# Patient Record
Sex: Male | Born: 1965 | Race: White | Hispanic: No | Marital: Married | Smoking: Current every day smoker
Health system: Southern US, Community
[De-identification: ages and names within clinical notes are randomized; demographics above are authoritative.]

## PROBLEM LIST (undated history)

## (undated) DIAGNOSIS — F419 Anxiety disorder, unspecified: Secondary | ICD-10-CM

## (undated) DIAGNOSIS — M87051 Idiopathic aseptic necrosis of right femur: Secondary | ICD-10-CM

## (undated) DIAGNOSIS — T8859XA Other complications of anesthesia, initial encounter: Secondary | ICD-10-CM

## (undated) DIAGNOSIS — M2550 Pain in unspecified joint: Secondary | ICD-10-CM

## (undated) DIAGNOSIS — M549 Dorsalgia, unspecified: Secondary | ICD-10-CM

## (undated) DIAGNOSIS — Z8709 Personal history of other diseases of the respiratory system: Secondary | ICD-10-CM

## (undated) DIAGNOSIS — M1990 Unspecified osteoarthritis, unspecified site: Secondary | ICD-10-CM

## (undated) DIAGNOSIS — T4145XA Adverse effect of unspecified anesthetic, initial encounter: Secondary | ICD-10-CM

## (undated) DIAGNOSIS — M2540 Effusion, unspecified joint: Secondary | ICD-10-CM

## (undated) DIAGNOSIS — J449 Chronic obstructive pulmonary disease, unspecified: Secondary | ICD-10-CM

## (undated) HISTORY — PX: KNEE SURGERY: SHX244

---

## 2010-01-31 ENCOUNTER — Encounter: Admission: RE | Admit: 2010-01-31 | Discharge: 2010-01-31 | Payer: Self-pay | Admitting: Sports Medicine

## 2011-06-15 ENCOUNTER — Other Ambulatory Visit (HOSPITAL_COMMUNITY): Payer: Self-pay | Admitting: Family Medicine

## 2011-06-15 ENCOUNTER — Ambulatory Visit (HOSPITAL_COMMUNITY)
Admission: RE | Admit: 2011-06-15 | Discharge: 2011-06-15 | Disposition: A | Discharge status: Home / Self Care | Payer: 59 | Source: Ambulatory Visit | Attending: Family Medicine | Admitting: Family Medicine

## 2011-06-15 DIAGNOSIS — J4489 Other specified chronic obstructive pulmonary disease: Secondary | ICD-10-CM | POA: Insufficient documentation

## 2011-06-15 DIAGNOSIS — I10 Essential (primary) hypertension: Secondary | ICD-10-CM | POA: Insufficient documentation

## 2011-06-15 DIAGNOSIS — R062 Wheezing: Secondary | ICD-10-CM | POA: Insufficient documentation

## 2011-06-15 DIAGNOSIS — R0989 Other specified symptoms and signs involving the circulatory and respiratory systems: Secondary | ICD-10-CM | POA: Insufficient documentation

## 2011-06-15 DIAGNOSIS — F17200 Nicotine dependence, unspecified, uncomplicated: Secondary | ICD-10-CM | POA: Insufficient documentation

## 2011-06-15 DIAGNOSIS — J449 Chronic obstructive pulmonary disease, unspecified: Secondary | ICD-10-CM | POA: Insufficient documentation

## 2014-01-14 ENCOUNTER — Ambulatory Visit (HOSPITAL_COMMUNITY)
Admission: RE | Admit: 2014-01-14 | Discharge: 2014-01-14 | Disposition: A | Discharge status: Home / Self Care | Payer: 59 | Source: Ambulatory Visit | Attending: Family Medicine | Admitting: Family Medicine

## 2014-01-14 ENCOUNTER — Other Ambulatory Visit (HOSPITAL_COMMUNITY): Payer: Self-pay | Admitting: Family Medicine

## 2014-01-14 DIAGNOSIS — M79644 Pain in right finger(s): Secondary | ICD-10-CM | POA: Diagnosis not present

## 2014-01-14 DIAGNOSIS — M2550 Pain in unspecified joint: Secondary | ICD-10-CM

## 2015-03-19 ENCOUNTER — Other Ambulatory Visit: Payer: Self-pay | Admitting: Orthopedic Surgery

## 2015-03-26 ENCOUNTER — Inpatient Hospital Stay (HOSPITAL_COMMUNITY)
Admission: RE | Admit: 2015-03-26 | Discharge: 2015-03-26 | Disposition: A | Discharge status: Home / Self Care | Payer: Self-pay | Source: Ambulatory Visit

## 2015-05-10 ENCOUNTER — Encounter (HOSPITAL_COMMUNITY)
Admission: RE | Admit: 2015-05-10 | Discharge: 2015-05-10 | Disposition: A | Discharge status: Home / Self Care | Payer: 59 | Source: Ambulatory Visit | Attending: Orthopedic Surgery | Admitting: Orthopedic Surgery

## 2015-05-10 ENCOUNTER — Encounter (HOSPITAL_COMMUNITY): Payer: Self-pay

## 2015-05-10 DIAGNOSIS — Z01818 Encounter for other preprocedural examination: Secondary | ICD-10-CM | POA: Diagnosis not present

## 2015-05-10 DIAGNOSIS — Z01812 Encounter for preprocedural laboratory examination: Secondary | ICD-10-CM | POA: Diagnosis not present

## 2015-05-10 DIAGNOSIS — M1611 Unilateral primary osteoarthritis, right hip: Secondary | ICD-10-CM | POA: Insufficient documentation

## 2015-05-10 HISTORY — DX: Anxiety disorder, unspecified: F41.9

## 2015-05-10 HISTORY — DX: Chronic obstructive pulmonary disease, unspecified: J44.9

## 2015-05-10 LAB — CBC
HEMATOCRIT: 46 % (ref 39.0–52.0)
Hemoglobin: 16 g/dL (ref 13.0–17.0)
MCH: 33.1 pg (ref 26.0–34.0)
MCHC: 34.8 g/dL (ref 30.0–36.0)
MCV: 95.2 fL (ref 78.0–100.0)
PLATELETS: 243 10*3/uL (ref 150–400)
RBC: 4.83 MIL/uL (ref 4.22–5.81)
RDW: 14.5 % (ref 11.5–15.5)
WBC: 9 10*3/uL (ref 4.0–10.5)

## 2015-05-10 LAB — BASIC METABOLIC PANEL
ANION GAP: 11 (ref 5–15)
BUN: 9 mg/dL (ref 6–20)
CALCIUM: 9.5 mg/dL (ref 8.9–10.3)
CO2: 26 mmol/L (ref 22–32)
CREATININE: 1.08 mg/dL (ref 0.61–1.24)
Chloride: 101 mmol/L (ref 101–111)
GFR calc Af Amer: >60 mL/min (ref >60)
GFR calc non Af Amer: >60 mL/min (ref >60)
Glucose, Bld: 94 mg/dL (ref 65–99)
POTASSIUM: 4.6 mmol/L (ref 3.5–5.1)
Sodium: 138 mmol/L (ref 135–145)

## 2015-05-10 LAB — SURGICAL PCR SCREEN
MRSA, PCR: NEGATIVE
STAPHYLOCOCCUS AUREUS: NEGATIVE

## 2015-05-10 NOTE — Pre-Procedure Instructions (Signed)
    Paul Francis  05/10/2015      [id number] - [place], [place] - [address] [place] [address] Phone: [phone] Fax: [phone]    Your procedure is scheduled on 05/18/15.  Report to [facility] Admitting at 530 A.M.  Call this number if you have problems the morning of surgery:  [phone]   Remember:  Do not eat food or drink liquids after midnight.  Take these medicines the morning of surgery with A SIP OF WATER- none   Do not wear jewelry, make-up or nail polish.  Do not wear lotions, powders, or perfumes.  You may wear deodorant.  Do not shave 48 hours prior to surgery.  Men may shave face and neck.  Do not bring valuables to the hospital.  [facility] is not responsible for any belongings or valuables.  Contacts, dentures or bridgework may not be worn into surgery.  Leave your suitcase in the car.  After surgery it may be brought to your room.  For patients admitted to the hospital, discharge time will be determined by your treatment team.  Patients discharged the day of surgery will not be allowed to drive home.   Name and phone number of your driver:   Special instructions:   Please read over the following fact sheets that you were given. Pain Booklet, Coughing and Deep Breathing, MRSA Information and Surgical Site Infection Prevention

## 2015-05-17 MED ORDER — CEFAZOLIN SODIUM-DEXTROSE 2-3 GM-% IV SOLR
2.0000 g | INTRAVENOUS | Status: AC
  Administered 2015-05-18: 2 g via INTRAVENOUS
  Filled 2015-05-17: qty 50

## 2015-05-18 ENCOUNTER — Inpatient Hospital Stay (HOSPITAL_COMMUNITY)
Admission: RE | Admit: 2015-05-18 | Discharge: 2015-05-19 | DRG: 470 | Disposition: A | Discharge status: Home / Self Care | Payer: 59 | Source: Ambulatory Visit | Attending: Orthopedic Surgery | Admitting: Orthopedic Surgery

## 2015-05-18 ENCOUNTER — Encounter (HOSPITAL_COMMUNITY): Payer: Self-pay | Admitting: Certified Registered Nurse Anesthetist

## 2015-05-18 ENCOUNTER — Inpatient Hospital Stay (HOSPITAL_COMMUNITY): Payer: 59

## 2015-05-18 ENCOUNTER — Encounter (HOSPITAL_COMMUNITY)
Admission: RE | Disposition: A | Discharge status: Home / Self Care | Payer: Self-pay | Source: Ambulatory Visit | Attending: Orthopedic Surgery

## 2015-05-18 ENCOUNTER — Inpatient Hospital Stay (HOSPITAL_COMMUNITY): Payer: 59 | Admitting: Certified Registered Nurse Anesthetist

## 2015-05-18 DIAGNOSIS — M87052 Idiopathic aseptic necrosis of left femur: Secondary | ICD-10-CM | POA: Diagnosis present

## 2015-05-18 DIAGNOSIS — F17210 Nicotine dependence, cigarettes, uncomplicated: Secondary | ICD-10-CM | POA: Diagnosis present

## 2015-05-18 DIAGNOSIS — J449 Chronic obstructive pulmonary disease, unspecified: Secondary | ICD-10-CM | POA: Diagnosis present

## 2015-05-18 DIAGNOSIS — M1610 Unilateral primary osteoarthritis, unspecified hip: Secondary | ICD-10-CM

## 2015-05-18 DIAGNOSIS — F419 Anxiety disorder, unspecified: Secondary | ICD-10-CM | POA: Diagnosis present

## 2015-05-18 DIAGNOSIS — M879 Osteonecrosis, unspecified: Secondary | ICD-10-CM | POA: Diagnosis present

## 2015-05-18 HISTORY — PX: TOTAL HIP ARTHROPLASTY: SHX124

## 2015-05-18 SURGERY — ARTHROPLASTY, HIP, TOTAL,POSTERIOR APPROACH
Anesthesia: Monitor Anesthesia Care | Site: Hip | Laterality: Left

## 2015-05-18 MED ORDER — DEXAMETHASONE SODIUM PHOSPHATE 10 MG/ML IJ SOLN
10.0000 mg | Freq: Once | INTRAMUSCULAR | Status: AC
  Administered 2015-05-19: 10 mg via INTRAVENOUS
  Filled 2015-05-18: qty 1

## 2015-05-18 MED ORDER — LACTATED RINGERS IV SOLN
INTRAVENOUS | Status: DC | PRN
  Administered 2015-05-18 (×2): via INTRAVENOUS

## 2015-05-18 MED ORDER — METHOCARBAMOL 500 MG PO TABS: 500.0000 mg | ORAL_TABLET | Freq: Four times a day (QID) | ORAL | Status: DC | PRN

## 2015-05-18 MED ORDER — HYDROMORPHONE HCL 1 MG/ML IJ SOLN: 1.0000 mg | INTRAMUSCULAR | Status: DC | PRN

## 2015-05-18 MED ORDER — OXYCODONE HCL 5 MG/5ML PO SOLN
5.0000 mg | Freq: Once | ORAL | Status: DC | PRN
Start: 2015-05-18 — End: 2015-05-18

## 2015-05-18 MED ORDER — PHENOL 1.4 % MT LIQD: 1.0000 | OROMUCOSAL | Status: DC | PRN

## 2015-05-18 MED ORDER — RIVAROXABAN 10 MG PO TABS
10.0000 mg | ORAL_TABLET | Freq: Every day | ORAL | Status: DC
  Administered 2015-05-19: 10 mg via ORAL
  Filled 2015-05-18: qty 1

## 2015-05-18 MED ORDER — FENTANYL CITRATE (PF) 100 MCG/2ML IJ SOLN
INTRAMUSCULAR | Status: DC | PRN
  Administered 2015-05-18 (×2): 25 ug via INTRAVENOUS

## 2015-05-18 MED ORDER — FENTANYL CITRATE (PF) 250 MCG/5ML IJ SOLN
INTRAMUSCULAR | Status: AC
  Filled 2015-05-18: qty 5

## 2015-05-18 MED ORDER — ONDANSETRON HCL 4 MG/2ML IJ SOLN
INTRAMUSCULAR | Status: AC
  Filled 2015-05-18: qty 2

## 2015-05-18 MED ORDER — CEFAZOLIN SODIUM-DEXTROSE 2-3 GM-% IV SOLR
2.0000 g | Freq: Four times a day (QID) | INTRAVENOUS | Status: AC
  Administered 2015-05-18 (×2): 2 g via INTRAVENOUS
  Filled 2015-05-18: qty 50

## 2015-05-18 MED ORDER — ONDANSETRON HCL 4 MG/2ML IJ SOLN
INTRAMUSCULAR | Status: DC | PRN
  Administered 2015-05-18: 4 mg via INTRAVENOUS

## 2015-05-18 MED ORDER — POLYETHYLENE GLYCOL 3350 17 G PO PACK: 17.0000 g | PACK | Freq: Every day | ORAL | Status: DC | PRN

## 2015-05-18 MED ORDER — PROPOFOL 10 MG/ML IV BOLUS
INTRAVENOUS | Status: AC
  Filled 2015-05-18: qty 20

## 2015-05-18 MED ORDER — ALUM & MAG HYDROXIDE-SIMETH 200-200-20 MG/5ML PO SUSP: 30.0000 mL | ORAL | Status: DC | PRN

## 2015-05-18 MED ORDER — METOCLOPRAMIDE HCL 5 MG PO TABS: 5.0000 mg | ORAL_TABLET | Freq: Three times a day (TID) | ORAL | Status: DC | PRN

## 2015-05-18 MED ORDER — HYDROMORPHONE HCL 1 MG/ML IJ SOLN
INTRAMUSCULAR | Status: AC
  Filled 2015-05-18: qty 1

## 2015-05-18 MED ORDER — MENTHOL 3 MG MT LOZG: 1.0000 | LOZENGE | OROMUCOSAL | Status: DC | PRN

## 2015-05-18 MED ORDER — POTASSIUM CHLORIDE IN NACL 20-0.45 MEQ/L-% IV SOLN
INTRAVENOUS | Status: DC
  Administered 2015-05-18: 21:00:00 via INTRAVENOUS
  Filled 2015-05-18 (×4): qty 1000

## 2015-05-18 MED ORDER — ONDANSETRON HCL 4 MG PO TABS: 4.0000 mg | ORAL_TABLET | Freq: Four times a day (QID) | ORAL | Status: DC | PRN

## 2015-05-18 MED ORDER — ACETAMINOPHEN 325 MG PO TABS: 325.0000 mg | ORAL_TABLET | ORAL | Status: DC | PRN

## 2015-05-18 MED ORDER — BISACODYL 10 MG RE SUPP: 10.0000 mg | Freq: Every day | RECTAL | Status: DC | PRN

## 2015-05-18 MED ORDER — MIDAZOLAM HCL 2 MG/2ML IJ SOLN
INTRAMUSCULAR | Status: AC
  Filled 2015-05-18: qty 2

## 2015-05-18 MED ORDER — BACLOFEN 10 MG PO TABS: 10.0000 mg | ORAL_TABLET | Freq: Three times a day (TID) | ORAL | Status: DC

## 2015-05-18 MED ORDER — ONDANSETRON HCL 4 MG/2ML IJ SOLN: 4.0000 mg | Freq: Four times a day (QID) | INTRAMUSCULAR | Status: DC | PRN

## 2015-05-18 MED ORDER — DOCUSATE SODIUM 100 MG PO CAPS
100.0000 mg | ORAL_CAPSULE | Freq: Two times a day (BID) | ORAL | Status: DC
  Administered 2015-05-18 – 2015-05-19 (×2): 100 mg via ORAL
  Filled 2015-05-18 (×2): qty 1

## 2015-05-18 MED ORDER — OXYCODONE-ACETAMINOPHEN 10-325 MG PO TABS: 1.0000 | ORAL_TABLET | Freq: Four times a day (QID) | ORAL | Status: DC | PRN

## 2015-05-18 MED ORDER — SENNA 8.6 MG PO TABS
1.0000 | ORAL_TABLET | Freq: Two times a day (BID) | ORAL | Status: DC
  Administered 2015-05-18 – 2015-05-19 (×2): 8.6 mg via ORAL
  Filled 2015-05-18 (×2): qty 1

## 2015-05-18 MED ORDER — OXYCODONE HCL 5 MG PO TABS
5.0000 mg | ORAL_TABLET | ORAL | Status: DC | PRN
  Administered 2015-05-18 (×2): 10 mg via ORAL
  Filled 2015-05-18 (×2): qty 2

## 2015-05-18 MED ORDER — SODIUM CHLORIDE 0.9 % IR SOLN
Status: DC | PRN
  Administered 2015-05-18: 1000 mL

## 2015-05-18 MED ORDER — PROPOFOL 500 MG/50ML IV EMUL
INTRAVENOUS | Status: DC | PRN
  Administered 2015-05-18: 100 ug/kg/min via INTRAVENOUS

## 2015-05-18 MED ORDER — METOCLOPRAMIDE HCL 5 MG/ML IJ SOLN: 5.0000 mg | Freq: Three times a day (TID) | INTRAMUSCULAR | Status: DC | PRN

## 2015-05-18 MED ORDER — DIPHENHYDRAMINE HCL 12.5 MG/5ML PO ELIX: 12.5000 mg | ORAL_SOLUTION | ORAL | Status: DC | PRN

## 2015-05-18 MED ORDER — BUPIVACAINE HCL (PF) 0.5 % IJ SOLN
INTRAMUSCULAR | Status: DC | PRN
  Administered 2015-05-18: 3 mL via INTRATHECAL

## 2015-05-18 MED ORDER — ACETAMINOPHEN 160 MG/5ML PO SOLN: 325.0000 mg | ORAL | Status: DC | PRN

## 2015-05-18 MED ORDER — ZOLPIDEM TARTRATE 5 MG PO TABS: 5.0000 mg | ORAL_TABLET | Freq: Every evening | ORAL | Status: DC | PRN

## 2015-05-18 MED ORDER — MIDAZOLAM HCL 5 MG/5ML IJ SOLN
INTRAMUSCULAR | Status: DC | PRN
  Administered 2015-05-18 (×2): 1 mg via INTRAVENOUS

## 2015-05-18 MED ORDER — KETOROLAC TROMETHAMINE 15 MG/ML IJ SOLN
INTRAMUSCULAR | Status: AC
  Filled 2015-05-18: qty 1

## 2015-05-18 MED ORDER — DEXTROSE 5 % IV SOLN
2.0000 g | INTRAVENOUS | Status: DC
  Filled 2015-05-18: qty 20

## 2015-05-18 MED ORDER — SENNA-DOCUSATE SODIUM 8.6-50 MG PO TABS: 2.0000 | ORAL_TABLET | Freq: Every day | ORAL | Status: DC

## 2015-05-18 MED ORDER — OXYCODONE HCL 5 MG PO TABS: 5.0000 mg | ORAL_TABLET | Freq: Once | ORAL | Status: DC | PRN

## 2015-05-18 MED ORDER — ACETAMINOPHEN 325 MG PO TABS: 650.0000 mg | ORAL_TABLET | Freq: Four times a day (QID) | ORAL | Status: DC | PRN

## 2015-05-18 MED ORDER — RIVAROXABAN 10 MG PO TABS: 10.0000 mg | ORAL_TABLET | Freq: Every day | ORAL | Status: DC

## 2015-05-18 MED ORDER — KETOROLAC TROMETHAMINE 15 MG/ML IJ SOLN
15.0000 mg | Freq: Four times a day (QID) | INTRAMUSCULAR | Status: AC
  Administered 2015-05-18 – 2015-05-19 (×4): 15 mg via INTRAVENOUS
  Filled 2015-05-18 (×3): qty 1

## 2015-05-18 MED ORDER — DEXTROSE 5 % IV SOLN
500.0000 mg | Freq: Four times a day (QID) | INTRAVENOUS | Status: DC | PRN
  Filled 2015-05-18: qty 5

## 2015-05-18 MED ORDER — HYDROMORPHONE HCL 1 MG/ML IJ SOLN
0.2500 mg | INTRAMUSCULAR | Status: DC | PRN
  Administered 2015-05-18 (×2): 0.5 mg via INTRAVENOUS

## 2015-05-18 MED ORDER — MAGNESIUM CITRATE PO SOLN: 1.0000 | Freq: Once | ORAL | Status: DC | PRN

## 2015-05-18 MED ORDER — ONDANSETRON HCL 4 MG PO TABS: 4.0000 mg | ORAL_TABLET | Freq: Three times a day (TID) | ORAL | Status: DC | PRN

## 2015-05-18 MED ORDER — ACETAMINOPHEN 650 MG RE SUPP: 650.0000 mg | Freq: Four times a day (QID) | RECTAL | Status: DC | PRN

## 2015-05-18 SURGICAL SUPPLY — 63 items
BIT DRILL 5/64X5 DISP (BIT) ×3 IMPLANT
BLADE SAW SAG 73X25 THK (BLADE) ×2
BLADE SAW SGTL 73X25 THK (BLADE) ×1 IMPLANT
BRUSH FEMORAL CANAL (MISCELLANEOUS) IMPLANT
CAPT HIP TOTAL 2 ×2 IMPLANT
CLOSURE STERI-STRIP 1/2X4 (GAUZE/BANDAGES/DRESSINGS)
CLSR STERI-STRIP ANTIMIC 1/2X4 (GAUZE/BANDAGES/DRESSINGS) ×2 IMPLANT
COVER SURGICAL LIGHT HANDLE (MISCELLANEOUS) ×3 IMPLANT
DRAPE IMP U-DRAPE 54X76 (DRAPES) ×1 IMPLANT
DRAPE INCISE IOBAN 66X45 STRL (DRAPES) IMPLANT
DRAPE ORTHO SPLIT 77X108 STRL (DRAPES) ×6
DRAPE PROXIMA HALF (DRAPES) ×4 IMPLANT
DRAPE SURG ORHT 6 SPLT 77X108 (DRAPES) ×2 IMPLANT
DRAPE U-SHAPE 47X51 STRL (DRAPES) ×3 IMPLANT
DRSG MEPILEX BORDER 4X12 (GAUZE/BANDAGES/DRESSINGS) IMPLANT
DRSG MEPILEX BORDER 4X8 (GAUZE/BANDAGES/DRESSINGS) ×2 IMPLANT
DRSG PAD ABDOMINAL 8X10 ST (GAUZE/BANDAGES/DRESSINGS) IMPLANT
DURAPREP 26ML APPLICATOR (WOUND CARE) ×3 IMPLANT
ELECT CAUTERY BLADE 6.4 (BLADE) ×3 IMPLANT
ELECT REM PT RETURN 9FT ADLT (ELECTROSURGICAL) ×3
ELECTRODE REM PT RTRN 9FT ADLT (ELECTROSURGICAL) ×1 IMPLANT
GLOVE BIOGEL PI IND STRL 8 (GLOVE) ×1 IMPLANT
GLOVE BIOGEL PI INDICATOR 8 (GLOVE) ×2
GLOVE BIOGEL PI ORTHO PRO SZ8 (GLOVE) ×2
GLOVE ORTHO TXT STRL SZ7.5 (GLOVE) ×3 IMPLANT
GLOVE PI ORTHO PRO STRL SZ8 (GLOVE) ×1 IMPLANT
GLOVE SURG ORTHO 8.0 STRL STRW (GLOVE) ×3 IMPLANT
GOWN STRL REUS W/ TWL XL LVL3 (GOWN DISPOSABLE) ×1 IMPLANT
GOWN STRL REUS W/TWL 2XL LVL3 (GOWN DISPOSABLE) ×3 IMPLANT
GOWN STRL REUS W/TWL XL LVL3 (GOWN DISPOSABLE) ×3
HANDPIECE INTERPULSE COAX TIP (DISPOSABLE)
HOOD PEEL AWAY FACE SHEILD DIS (HOOD) ×8 IMPLANT
KIT BASIN OR (CUSTOM PROCEDURE TRAY) ×3 IMPLANT
KIT ROOM TURNOVER OR (KITS) ×3 IMPLANT
MANIFOLD NEPTUNE II (INSTRUMENTS) ×3 IMPLANT
NDL HYPO 25GX1X1/2 BEV (NEEDLE) ×1 IMPLANT
NDL SUT .5 MAYO 1.404X.05X (NEEDLE) ×1 IMPLANT
NEEDLE HYPO 25GX1X1/2 BEV (NEEDLE) ×3 IMPLANT
NEEDLE MAYO TAPER (NEEDLE) ×3
NS IRRIG 1000ML POUR BTL (IV SOLUTION) ×3 IMPLANT
PACK TOTAL JOINT (CUSTOM PROCEDURE TRAY) ×3 IMPLANT
PAD ARMBOARD 7.5X6 YLW CONV (MISCELLANEOUS) ×6 IMPLANT
PILLOW ABDUCTION HIP (SOFTGOODS) ×3 IMPLANT
PRESSURIZER FEMORAL UNIV (MISCELLANEOUS) IMPLANT
RETRIEVER SUT HEWSON (MISCELLANEOUS) ×3 IMPLANT
SET HNDPC FAN SPRY TIP SCT (DISPOSABLE) IMPLANT
SPONGE LAP 4X18 X RAY DECT (DISPOSABLE) IMPLANT
SUCTION FRAZIER HANDLE 10FR (MISCELLANEOUS) ×2
SUCTION TUBE FRAZIER 10FR DISP (MISCELLANEOUS) ×1 IMPLANT
SUT FIBERWIRE #2 38 REV NDL BL (SUTURE) ×9
SUT MNCRL AB 4-0 PS2 18 (SUTURE) ×3 IMPLANT
SUT VIC AB 0 CT1 27 (SUTURE) ×3
SUT VIC AB 0 CT1 27XBRD ANBCTR (SUTURE) ×1 IMPLANT
SUT VIC AB 2-0 CT1 27 (SUTURE) ×3
SUT VIC AB 2-0 CT1 TAPERPNT 27 (SUTURE) ×1 IMPLANT
SUT VIC AB 3-0 SH 8-18 (SUTURE) ×3 IMPLANT
SUTURE FIBERWR#2 38 REV NDL BL (SUTURE) ×3 IMPLANT
SYR CONTROL 10ML LL (SYRINGE) ×3 IMPLANT
TOWEL OR 17X24 6PK STRL BLUE (TOWEL DISPOSABLE) ×3 IMPLANT
TOWEL OR 17X26 10 PK STRL BLUE (TOWEL DISPOSABLE) ×3 IMPLANT
TOWER CARTRIDGE SMART MIX (DISPOSABLE) IMPLANT
TRAY FOLEY CATH 14FR (SET/KITS/TRAYS/PACK) ×2 IMPLANT
WATER STERILE IRR 1000ML POUR (IV SOLUTION) ×2 IMPLANT

## 2015-05-18 NOTE — H&P (Signed)
PREOPERATIVE H&P  Chief Complaint: AVN left hip  HPI: Paul Francis is a 50 y.o. male who presents for preoperative history and physical with a diagnosis of AVN left hip. Symptoms are rated as moderate to severe, and have been worsening.  This is significantly impairing activities of daily living.  He has elected for surgical management.   He has failed injections, activity modification, anti-inflammatories, and assistive devices.  Preoperative X-rays demonstrate end stage degenerative changes with osteophyte formation, loss of joint space, subchondral sclerosis.   Past Medical History  Diagnosis Date  . COPD (chronic obstructive pulmonary disease) (HCC)   . Anxiety    Past Surgical History  Procedure Laterality Date  . Knee surgery      needle stuck in knee  . Broken      rt hand   Social History   Social History  . Marital Status: Married    Spouse Name: N/A  . Number of Children: N/A  . Years of Education: N/A   Social History Main Topics  . Smoking status: Current Every Day Smoker -- 1.50 packs/day for 35 years  . Smokeless tobacco: None  . Alcohol Use: Yes     Comment: daily  . Drug Use: No  . Sexual Activity: Not Asked   Other Topics Concern  . None   Social History Narrative   History reviewed. No pertinent family history. No Known Allergies Prior to Admission medications   Not on File     Positive ROS: All other systems have been reviewed and were otherwise negative with the exception of those mentioned in the HPI and as above.  Physical Exam: General: Alert, no acute distress Cardiovascular: No pedal edema Respiratory: No cyanosis, no use of accessory musculature GI: No organomegaly, abdomen is soft and non-tender Skin: No lesions in the area of chief complaint Neurologic: Sensation intact distally Psychiatric: Patient is competent for consent with normal mood and affect Lymphatic: No axillary or cervical lymphadenopathy  MUSCULOSKELETAL: left  hip ROM 0-100 with pain with IR/ER, limited motion due to pain.  EHL intact.  Assessment: AVN left hip   Plan: Plan for Procedure(s): LEFT TOTAL HIP ARTHROPLASTY  The risks benefits and alternatives were discussed with the patient including but not limited to the risks of nonoperative treatment, versus surgical intervention including infection, bleeding, nerve injury,  blood clots, cardiopulmonary complications, morbidity, mortality, among others, and they were willing to proceed.   Eulas Post, MD Cell [phone]   05/18/2015 7:23 AM

## 2015-05-18 NOTE — Anesthesia Procedure Notes (Signed)
Spinal Patient location during procedure: OR Staffing Anesthesiologist: [place] Zou Preanesthetic Checklist Completed: patient identified, surgical consent, pre-op evaluation, timeout performed, IV checked, risks and benefits discussed and monitors and equipment checked Spinal Block Patient position: sitting Prep: site prepped and draped and DuraPrep Patient monitoring: heart rate, cardiac monitor, continuous pulse ox and blood pressure Approach: midline Location: L3-4 Injection technique: single-shot Needle Needle type: Pencan  Needle gauge: 24 G Needle length: 10 cm Assessment Sensory level: T8   

## 2015-05-18 NOTE — Op Note (Signed)
05/18/2015  9:24 AM  PATIENT:  Paul Francis   MRN: [id number]  PRE-OPERATIVE DIAGNOSIS:  Left hip avascular necrosis  POST-OPERATIVE DIAGNOSIS:  SAME  PROCEDURE:  Procedure(s): LEFT TOTAL HIP ARTHROPLASTY  PREOPERATIVE INDICATIONS:    Paul Francis is an 50 y.o. male who has a diagnosis of Avascular necrosis of bone of left hip ([place]) and elected for surgical management after failing conservative treatment.  The risks benefits and alternatives were discussed with the patient including but not limited to the risks of nonoperative treatment, versus surgical intervention including infection, bleeding, nerve injury, periprosthetic fracture, the need for revision surgery, dislocation, leg length discrepancy, blood clots, cardiopulmonary complications, morbidity, mortality, among others, and they were willing to proceed.     OPERATIVE REPORT     SURGEON:  Marchia Bond, MD    ASSISTANT:  Joya Gaskins, OPA-C  (Present throughout the entire procedure,  necessary for completion of procedure in a timely manner, assisting with retraction, instrumentation, and closure)     ANESTHESIA:  Spinal    COMPLICATIONS:  None.     COMPONENTS:  [employer] fit femur size 7 with a 36 mm +5 ceramic head ball and a gription acetabular shell size 54 with a neutral +4 polyethylene liner and a single cancellous screw    PROCEDURE IN DETAIL:   The patient was met in the holding area and  identified.  The appropriate hip was identified and marked at the operative site.  The patient was then transported to the OR  and  placed under general anesthesia.  At that point, the patient was  placed in the lateral decubitus position with the operative side up and  secured to the operating room table and all bony prominences padded.     The operative lower extremity was prepped from the iliac crest to the distal leg.  Sterile draping was performed.  Time out was performed prior to incision.      A routine  posterolateral approach was utilized via sharp dissection  carried down to the subcutaneous tissue.  Gross bleeders were Bovie coagulated.  The iliotibial band was identified and incised along the length of the skin incision.  Self-retaining retractors were  inserted.  With the hip internally rotated, the short external rotators  were identified. The piriformis and capsule was tagged with FiberWire, and the hip capsule released in a T-type fashion.  The femoral neck was exposed, and I resected the femoral neck using the appropriate jig. This was performed at approximately a thumb's breadth above the lesser trochanter.    I then exposed the deep acetabulum, cleared out any tissue including the ligamentum teres.  A wing retractor was placed.   After adequate visualization, I excised the labrum, and then sequentially reamed.  I placed the trial acetabulum, which seated nicely, and then impacted the real cup into place.  Appropriate version and inclination was confirmed clinically matching their bony anatomy, and also with the use of the jig.  The acetabulum was fairly shallow, and I did not have much bone to medialize. This ultimately left some uncoverage nearly circumferentially. For this reason I did use a screw to augment fixation although he did have good fit and fill from front to back.  A trial polyethylene liner was placed and the wing retractor removed.    I then prepared the proximal femur using the cookie-cutter, the lateralizing reamer, and then sequentially reamed and broached.  A trial broach, neck, and head was utilized, and  I reduced the hip and it was found to have excellent stability with functional range of motion. The trial components were then removed, and the real polyethylene liner was placed.  I initially trialed with a lipped liner, and had excellent posterior stability, although some impingement in external rotation. Trial with the neutral non-lipped liner provided better motion in  external rotation. The posterior stability was still excellent.  I then impacted the real femoral prosthesis into place into the appropriate version, slightly anteverted to the normal anatomy, and I impacted the real head ball into place. The hip was then reduced and taken through functional range of motion and found to have excellent stability. Leg lengths were restored.  I then used a 2 mm drill bits to pass the FiberWire suture from the capsule and piriformis through the greater trochanter, and secured this. Excellent posterior capsular repair was achieved. I also closed the T in the capsule.  I then irrigated the hip copiously again with pulse lavage, and repaired the fascia with Vicryl, followed by Vicryl for the subcutaneous tissue, Monocryl for the skin, Steri-Strips and sterile gauze. The wounds were injected. The patient was then awakened and returned to PACU in stable and satisfactory condition. There were no complications.  Marchia Bond, MD Orthopedic Surgeon [phone]   05/18/2015 9:24 AM

## 2015-05-18 NOTE — Anesthesia Preprocedure Evaluation (Signed)
Anesthesia Evaluation  Patient identified by MRN, date of birth, ID band Patient awake    Reviewed: Allergy & Precautions, NPO status , Patient's Chart, lab work & pertinent test results  Airway Mallampati: II  TM Distance: >3 FB     Dental  (+) Teeth Intact   Pulmonary neg shortness of breath, neg sleep apnea, neg COPD, neg recent URI, Current Smoker, neg PE   breath sounds clear to auscultation- rhonchi       Cardiovascular negative cardio ROS   Rhythm:Regular     Neuro/Psych Anxiety negative neurological ROS     GI/Hepatic negative GI ROS, Neg liver ROS,   Endo/Other  negative endocrine ROS  Renal/GU negative Renal ROS     Musculoskeletal  (+) Arthritis ,   Abdominal   Peds  Hematology negative hematology ROS (+)   Anesthesia Other Findings   Reproductive/Obstetrics                             Anesthesia Physical Anesthesia Plan  ASA: II  Anesthesia Plan: MAC and Spinal   Post-op Pain Management:    Induction: Intravenous  Airway Management Planned: Nasal Cannula, Natural Airway and Simple Face Mask  Additional Equipment: None  Intra-op Plan:   Post-operative Plan:   Informed Consent: I have reviewed the patients History and Physical, chart, labs and discussed the procedure including the risks, benefits and alternatives for the proposed anesthesia with the patient or authorized representative who has indicated his/her understanding and acceptance.   Dental advisory given  Plan Discussed with: CRNA and Surgeon  Anesthesia Plan Comments:         Anesthesia Quick Evaluation

## 2015-05-18 NOTE — Transfer of Care (Signed)
Immediate Anesthesia Transfer of Care Note  Patient: Paul Francis  Procedure(s) Performed: Procedure(s): LEFT TOTAL HIP ARTHROPLASTY (Left)  Patient Location: PACU  Anesthesia Type:MAC and Spinal  Level of Consciousness: awake, patient cooperative and responds to stimulation  Airway & Oxygen Therapy: Patient Spontanous Breathing  Post-op Assessment: Report given to RN and Post -op Vital signs reviewed and stable  Post vital signs: Reviewed and stable  Last Vitals:  Filed Vitals:   05/18/15 0638  BP: 165/95  Pulse: 77  Temp: 36.7 C  Resp: 16    Complications: No apparent anesthesia complications

## 2015-05-18 NOTE — Evaluation (Signed)
Physical Therapy Evaluation Patient Details Name: Paul Francis MRN: [id number] DOB: 14-Jul-1965 Today's Date: 05/18/2015   History of Present Illness  50 y.o. male admitted to [facility] on 05/18/15 for elective L THA posterior approach.  Pt with significant PMHx of AVN, COPD, anxiety, R hand fx, and left knee surgery (needles stuck in knee per chart).    Clinical Impression  Pt is POD #0 and is moving well. Min assist overall for short distance gait in the room with RW.  He did feel a non-painful pop when preforming hip abduction exercise in the bed prior to mobilizing.  With repeated hip abduction it did not recur, but pt was worried about it.  I advised him to talk to Dr. Dion Saucier when he came by. Pt reports his hips popped when he went to sit at baseline.  He was able to continue on with mobility and gait without complication. He will likely progress well enough to d/c home with his wife's assist and HHPT.   PT to follow acutely for deficits listed below.       Follow Up Recommendations Home health PT;Supervision for mobility/OOB    Equipment Recommendations  None recommended by PT (equipment was delivered to his home PTA)    Recommendations for Other Services   NA     Precautions / Restrictions Precautions Precautions: Posterior Hip Precaution Booklet Issued: Yes (comment) Precaution Comments: precaution handout given and reveiwed, hip exercise handout given Required Braces or Orthoses: Other Brace/Splint Other Brace/Splint: hip abduction pillow for in the bed.  Restrictions Weight Bearing Restrictions: Yes LLE Weight Bearing: Weight bearing as tolerated      Mobility  Bed Mobility Overal bed mobility: Needs Assistance Bed Mobility: Supine to Sit     Supine to sit: Min assist     General bed mobility comments: Min assist to help progress his left leg over EOB verbal cues for 1/2 bridge technique.   Transfers Overall transfer level: Needs assistance Equipment used: Rolling walker  (2 wheeled) Transfers: Sit to/from Stand Sit to Stand: Min assist;From elevated surface         General transfer comment: Min assist to help stabilize trunk and RW to get to standing.  Verbal cues for safe hand placement, but despite this, pt still pulled up with both hands on RW.   Ambulation/Gait Ambulation/Gait assistance: Min assist Ambulation Distance (Feet): 8 Feet Assistive device: Rolling walker (2 wheeled) Gait Pattern/deviations: Step-to pattern;Antalgic     General Gait Details: Min assist to support trunk for balance.  Verbal cues for correct LE sequencing and WBAT status.  Hip precautions reviewed before gaitn and mobility.  Handout given.  WBAT status also reviewed.          Balance Overall balance assessment: Needs assistance Sitting-balance support: Feet supported;No upper extremity supported Sitting balance-Leahy Scale: Good     Standing balance support: Bilateral upper extremity supported Standing balance-Leahy Scale: Poor                               Pertinent Vitals/Pain Pain Assessment: 0-10 Pain Score: 5  Pain Location: left thigh Pain Descriptors / Indicators: Aching;Burning Pain Intervention(s): Limited activity within patient's tolerance;Monitored during session;Repositioned;Patient requesting pain meds-RN notified    Home Living Family/patient expects to be discharged to:: Private residence Living Arrangements: Spouse/significant other Available Help at Discharge: Family;Available 24 hours/day Type of Home: House Home Access: Stairs to enter Entrance Stairs-Rails: None Entrance Stairs-Number of  Steps: 1 (small stoop) Home Layout: One level Home Equipment: Bedside commode;Walker - 2 wheels      Prior Function Level of Independence: Independent               Hand Dominance   Dominant Hand: Right    Extremity/Trunk Assessment   Upper Extremity Assessment: Defer to OT evaluation           Lower Extremity  Assessment: LLE deficits/detail   LLE Deficits / Details: left leg with normal post op pain and weakness.  Pt with at least 3/5 ankle, 3-/5 knee, 2/5 hip.  Of note, during warm up exercises in bed pt reported and therapist felt a pop on the lateral aspect of his leg.  Pt reported is was not painful, only unnerving.  He also reported that his hip used to pop like that on both sides when he went to sit down.  The pop was on the first rep of hip abduction and did not happen again with consecutive reps.  Given that I felt it at his knee I think it may have been his ITB.  I advised hip to speak with Dr. Dion Saucier about it.   Cervical / Trunk Assessment: Normal  Communication   Communication: No difficulties  Cognition Arousal/Alertness: Awake/alert Behavior During Therapy: WFL for tasks assessed/performed Overall Cognitive Status: Within Functional Limits for tasks assessed                         Exercises Total Joint Exercises Ankle Circles/Pumps: AROM;Both;20 reps Quad Sets: AROM;Left;10 reps Heel Slides: AAROM;Left;10 reps Hip ABduction/ADduction: AAROM;Left;10 reps (on first rep palpable pop at left hip, no pain, unnerving)      Assessment/Plan    PT Assessment Patient needs continued PT services  PT Diagnosis Difficulty walking;Abnormality of gait;Generalized weakness;Acute pain   PT Problem List Decreased strength;Decreased range of motion;Decreased activity tolerance;Decreased balance;Decreased mobility;Decreased knowledge of use of DME;Decreased knowledge of precautions;Pain  PT Treatment Interventions DME instruction;Gait training;Stair training;Functional mobility training;Therapeutic activities;Therapeutic exercise;Balance training;Neuromuscular re-education;Patient/family education;Modalities;Manual techniques   PT Goals (Current goals can be found in the Care Plan section) Acute Rehab PT Goals Patient Stated Goal: to decrease left hip and knee pain PT Goal Formulation:  With patient Time For Goal Achievement: 05/25/15 Potential to Achieve Goals: Good    Frequency 7X/week           End of Session   Activity Tolerance: Patient tolerated treatment well Patient left: in chair;with call bell/phone within reach           Time: 1610-9604 PT Time Calculation (min) (ACUTE ONLY): 35 min   Charges:   PT Evaluation $PT Eval Moderate Complexity: 1 Procedure PT Treatments $Therapeutic Activity: 8-22 mins        Sharlena Kristensen B. Cheryel Kyte, PT, DPT [phone]   05/18/2015, 5:52 PM

## 2015-05-18 NOTE — Progress Notes (Signed)
Utilization review completed.  

## 2015-05-18 NOTE — Discharge Instructions (Signed)

## 2015-05-19 ENCOUNTER — Encounter (HOSPITAL_COMMUNITY): Payer: Self-pay | Admitting: Orthopedic Surgery

## 2015-05-19 LAB — CBC
HCT: 36.1 % — ABNORMAL LOW (ref 39.0–52.0)
Hemoglobin: 12.5 g/dL — ABNORMAL LOW (ref 13.0–17.0)
MCH: 32.9 pg (ref 26.0–34.0)
MCHC: 34.6 g/dL (ref 30.0–36.0)
MCV: 95 fL (ref 78.0–100.0)
Platelets: 192 10*3/uL (ref 150–400)
RBC: 3.8 MIL/uL — ABNORMAL LOW (ref 4.22–5.81)
RDW: 14.2 % (ref 11.5–15.5)
WBC: 9.4 10*3/uL (ref 4.0–10.5)

## 2015-05-19 LAB — BASIC METABOLIC PANEL
Anion gap: 6 (ref 5–15)
BUN: 11 mg/dL (ref 6–20)
CALCIUM: 8.5 mg/dL — AB (ref 8.9–10.3)
CO2: 26 mmol/L (ref 22–32)
CREATININE: 1.11 mg/dL (ref 0.61–1.24)
Chloride: 104 mmol/L (ref 101–111)
GFR calc non Af Amer: >60 mL/min (ref >60)
Glucose, Bld: 110 mg/dL — ABNORMAL HIGH (ref 65–99)
Potassium: 4.3 mmol/L (ref 3.5–5.1)
SODIUM: 136 mmol/L (ref 135–145)

## 2015-05-19 NOTE — Discharge Summary (Signed)
Physician Discharge Summary  Patient ID: Paul Francis MRN: [id number] DOB/AGE: [birth year] 50 y.o.  Admit date: 05/18/2015 Discharge date: 05/19/2015  Admission Diagnoses:  Avascular necrosis of bone of left hip [facility])  Discharge Diagnoses:  Principal Problem:   Avascular necrosis of bone of left hip [facility])   Past Medical History  Diagnosis Date  . COPD (chronic obstructive pulmonary disease) (HCC)   . Anxiety   . Avascular necrosis of bone of left hip (HCC) 05/18/2015    Surgeries: Procedure(s): LEFT TOTAL HIP ARTHROPLASTY on 05/18/2015   Consultants (if any):    Discharged Condition: Improved  Hospital Course: Paul Francis is an 50 y.o. male who was admitted 05/18/2015 with a diagnosis of Avascular necrosis of bone of left hip (HCC) and went to the operating room on 05/18/2015 and underwent the above named procedures.    He was given perioperative antibiotics:  Anti-infectives    Start     Dose/Rate Route Frequency Ordered Stop   05/18/15 1400  ceFAZolin (ANCEF) IVPB 2 g/50 mL premix     2 g 100 mL/hr over 30 Minutes Intravenous Every 6 hours 05/18/15 1352 05/18/15 2131   05/18/15 1400  ceFAZolin (ANCEF) 2 g in dextrose 5 % 50 mL IVPB  Status:  Discontinued     2 g 100 mL/hr over 30 Minutes Intravenous To Post Anesthesia Care Unit 05/18/15 1355 05/18/15 1509   05/18/15 0600  ceFAZolin (ANCEF) IVPB 2 g/50 mL premix     2 g 100 mL/hr over 30 Minutes Intravenous On call to O.R. 05/17/15 1210 05/18/15 0759    .  He was given sequential compression devices, early ambulation, and xarelto for DVT prophylaxis.  He benefited maximally from the hospital stay and there were no complications.    Recent vital signs:  Filed Vitals:   05/19/15 0120 05/19/15 0507  BP:  148/86  Pulse:  90  Temp: 99.8 F (37.7 C) 100.1 F (37.8 C)  Resp:  18    Recent laboratory studies:  Lab Results  Component Value Date   HGB 12.5* 05/19/2015   HGB 16.0 05/10/2015   Lab Results  Component  Value Date   WBC 9.4 05/19/2015   PLT 192 05/19/2015   No results found for: INR Lab Results  Component Value Date   NA 136 05/19/2015   K 4.3 05/19/2015   CL 104 05/19/2015   CO2 26 05/19/2015   BUN 11 05/19/2015   CREATININE 1.11 05/19/2015   GLUCOSE 110* 05/19/2015    Discharge Medications:     Medication List    TAKE these medications        baclofen 10 MG tablet  Commonly known as:  LIORESAL  Take 1 tablet (10 mg total) by mouth 3 (three) times daily. As needed for muscle spasm     ondansetron 4 MG tablet  Commonly known as:  ZOFRAN  Take 1 tablet (4 mg total) by mouth every 8 (eight) hours as needed for nausea or vomiting.     oxyCODONE-acetaminophen 10-325 MG tablet  Commonly known as:  PERCOCET  Take 1-2 tablets by mouth every 6 (six) hours as needed for pain. MAXIMUM TOTAL ACETAMINOPHEN DOSE IS 4000 MG PER DAY     rivaroxaban 10 MG Tabs tablet  Commonly known as:  XARELTO  Take 1 tablet (10 mg total) by mouth daily.     sennosides-docusate sodium 8.6-50 MG tablet  Commonly known as:  SENOKOT-S  Take 2 tablets by mouth daily.  Diagnostic Studies: Dg Pelvis Portable  05/18/2015  CLINICAL DATA:  Status post left hip replacement EXAM: PORTABLE PELVIS 1-2 VIEWS COMPARISON:  None. FINDINGS: A left hip replacement is noted. No acute bony abnormality is seen. Air is noted in surgical bed. Pelvic ring is intact. IMPRESSION: Status post left hip replacement Electronically Signed   By: Alcide Clever M.D.   On: 05/18/2015 11:21    Disposition: Final discharge disposition not confirmed        Follow-up Information    Follow up with Eulas Post, MD. Schedule an appointment as soon as possible for a visit in 2 weeks.   Specialty:  Orthopedic Surgery   Contact information:   [address] [place] [address] [phone]        Signed: Eulas Post 05/19/2015, 8:42 AM

## 2015-05-19 NOTE — Progress Notes (Signed)
Physical Therapy Treatment Patient Details Name: Paul Francis MRN: [id number] DOB: [birth year] Today's Date: 05/19/2015    History of Present Illness 50 y.o. male admitted to [facility] on 05/18/15 for elective L THA posterior approach.  Pt with significant PMHx of AVN, COPD, anxiety, R hand fx, and left knee surgery (needles stuck in knee per chart).      PT Comments    Pt is POD #1 and is progressing well with his mobility. He reported some lightheadedness/"hot" before ambulating.  Vitals checked and were stable.  We proceeded with caution.  He was able to walk with supervision and RW into the hallway without difficulty.  He needs further reinforcement of hip precautions, exercises, and to practice the curb step to enter his home this afternoon.    Follow Up Recommendations  Home health PT;Supervision for mobility/OOB     Equipment Recommendations  None recommended by PT (equipment delivered to his home PTA)    Recommendations for Other Services   NA     Precautions / Restrictions Precautions Precautions: Posterior Hip Precaution Booklet Issued: Yes (comment) Precaution Comments: pt reported 2/3 precautions to therapist today Required Braces or Orthoses: Other Brace/Splint Other Brace/Splint: hip abduction pillow for in the bed.  Restrictions Weight Bearing Restrictions: No LLE Weight Bearing: Weight bearing as tolerated    Mobility  Bed Mobility Overal bed mobility: Needs Assistance Bed Mobility: Supine to Sit     Supine to sit: Min assist     General bed mobility comments: A for LLE only  Transfers Overall transfer level: Needs assistance Equipment used: Rolling walker (2 wheeled) Transfers: Sit to/from Stand Sit to Stand: Supervision         General transfer comment: supervision for safety due to heavy reliance on hands for transitions with RW.    Ambulation/Gait Ambulation/Gait assistance: Supervision Ambulation Distance (Feet): 200 Feet Assistive device:  Rolling walker (2 wheeled) Gait Pattern/deviations: Step-to pattern;Antalgic Gait velocity: decreased Gait velocity interpretation: Below normal speed for age/gender General Gait Details: Chair to follow for safety as pt reported some hot/lightheaded feelings in room prior to gait.  Vitals taken and were stable. Moderately antalgic gait pattern.  No cues needed for sequence and pt educated that when he transitions to a cane with HHPT he will hold the cane in the right hand.  This PM we will focus on step through gait pattern and more normal heel to toe contact.    Stairs Stairs: Yes       General stair comments: Verbally reviewed up with the non surgical leg and down with the surgical leg with pt, but did not physically practice stoop yet.          Balance Overall balance assessment: Needs assistance Sitting-balance support: Feet supported;No upper extremity supported Sitting balance-Leahy Scale: Good     Standing balance support: Bilateral upper extremity supported Standing balance-Leahy Scale: Fair                      Cognition Arousal/Alertness: Awake/alert Behavior During Therapy: WFL for tasks assessed/performed Overall Cognitive Status: Within Functional Limits for tasks assessed                      Exercises Total Joint Exercises Ankle Circles/Pumps: AROM;Both;20 reps Quad Sets: AROM;Both;10 reps Heel Slides: AAROM;Left;10 reps Hip ABduction/ADduction: AAROM;Left;10 reps Long Arc Quad: AROM;Left;10 reps        Pertinent Vitals/Pain Pain Assessment: 0-10 Pain Score: 6  Pain Location: left  thigh, incision Pain Descriptors / Indicators: Aching;Burning Pain Intervention(s): Limited activity within patient's tolerance;Monitored during session;Repositioned    Home Living Family/patient expects to be discharged to:: Private residence Living Arrangements: Spouse/significant other Available Help at Discharge: Family;Available 24 hours/day Type of  Home: House Home Access: Stairs to enter Entrance Stairs-Rails: None Home Layout: One level Home Equipment: Bedside commode;Walker - 2 wheels      Prior Function Level of Independence: Independent          PT Goals (current goals can now be found in the care plan section) Acute Rehab PT Goals Patient Stated Goal: home today or tomorrow Progress towards PT goals: Progressing toward goals    Frequency  7X/week    PT Plan Current plan remains appropriate       End of Session   Activity Tolerance: Patient limited by pain;Patient limited by fatigue Patient left: in chair;with call bell/phone within reach     Time: 1042-1110 PT Time Calculation (min) (ACUTE ONLY): 28 min  Charges:  $Gait Training: 8-22 mins $Therapeutic Exercise: 8-22 mins                   Jaydeen Darley B. Eeva Schlosser, PT, DPT [phone]   05/19/2015, 11:19 AM

## 2015-05-19 NOTE — Anesthesia Postprocedure Evaluation (Signed)
Anesthesia Post Note  Patient: RIKER COLLIER  Procedure(s) Performed: Procedure(s) (LRB): LEFT TOTAL HIP ARTHROPLASTY (Left)  Patient location during evaluation: PACU Anesthesia Type: Spinal and MAC Level of consciousness: awake Pain management: pain level controlled Vital Signs Assessment: post-procedure vital signs reviewed and stable Respiratory status: spontaneous breathing and respiratory function stable Cardiovascular status: stable Postop Assessment: no signs of nausea or vomiting    Last Vitals:  Filed Vitals:   05/19/15 0507 05/19/15 1048  BP: 148/86 136/77  Pulse: 90 91  Temp: 37.8 C   Resp: 18     Last Pain:  Filed Vitals:   05/19/15 1048  PainSc: 2                  Patrizia Paule

## 2015-05-19 NOTE — Progress Notes (Signed)
Physical Therapy Treatment Patient Details Name: Paul Francis MRN: [id number] DOB: 1965-11-28 Today's Date: 05/19/2015    History of Present Illness 50 y.o. male admitted to [facility] on 05/18/15 for elective L THA posterior approach.  Pt with significant PMHx of AVN, COPD, anxiety, R hand fx, and left knee surgery (needles stuck in knee per chart).      PT Comments    Pt performed standing therapeutic exercise and curb training in preparation for d/c home.  Pt demonstrated verbal understanding post tx.    Follow Up Recommendations  Home health PT;Supervision for mobility/OOB     Equipment Recommendations  None recommended by PT    Recommendations for Other Services       Precautions / Restrictions Precautions Precautions: Posterior Hip Precaution Booklet Issued: Yes (comment) (re-issued HEP with hip precautions as pt reports he misplaced copy.) Precaution Comments: Pt able to recall 3/3 hip precautions during intervention with good carryover during mobility.   Required Braces or Orthoses: Other Brace/Splint Other Brace/Splint: hip abduction pillow for in the bed.  Restrictions Weight Bearing Restrictions: Yes LLE Weight Bearing: Weight bearing as tolerated    Mobility  Bed Mobility Overal bed mobility: Needs Assistance Bed Mobility: Supine to Sit     Supine to sit: Min assist     General bed mobility comments: A for LLE only  Transfers Overall transfer level: Needs assistance Equipment used: Rolling walker (2 wheeled) Transfers: Sit to/from Stand Sit to Stand: Supervision         General transfer comment: cues for technique during treatment to maintain safety pts demonstrates correct technique as tx progressed.    Ambulation/Gait Ambulation/Gait assistance: Supervision Ambulation Distance (Feet): 220 Feet Assistive device: Rolling walker (2 wheeled) Gait Pattern/deviations: Step-to pattern;Step-through pattern;Antalgic;Decreased step length - left Gait velocity:  decreased   General Gait Details: Pt progressed to step through pattern to simulate a more normal gt sequencing.  Pt reports tightness during progress but reports change is tolerable.     Stairs Stairs: Yes Stairs assistance: Supervision Stair Management: No rails Number of Stairs: 2 General stair comments: curb x 2 trials both forward and backwards ascending for options.  pt required cues for sequencing to improve safety.  Pt able to teach back method to PTA on repeated attempt.    Wheelchair Mobility    Modified Rankin (Stroke Patients Only)       Balance     Sitting balance-Leahy Scale: Good       Standing balance-Leahy Scale: Good                      Cognition Arousal/Alertness: Awake/alert Behavior During Therapy: WFL for tasks assessed/performed Overall Cognitive Status: Within Functional Limits for tasks assessed                      Exercises Total Joint Exercises Standing Hip Extension: AROM;Standing;10 reps;Left (also performed standing HS curl AROM with LLE x 10 reps.) General Exercises - Lower Extremity Hip ABduction/ADduction: AROM;Standing;Left;10 reps Hip Flexion/Marching: AROM;Standing;Left;10 reps Heel Raises: AROM;Both;10 reps;Standing Mini-Sqauts: Standing;AROM;Both;10 reps    General Comments        Pertinent Vitals/Pain Pain Assessment: 0-10 Pain Score: 5  Pain Descriptors / Indicators: Aching;Burning Pain Intervention(s): Monitored during session;Repositioned    Home Living                      Prior Function  PT Goals (current goals can now be found in the care plan section) Acute Rehab PT Goals Patient Stated Goal: home today.   Potential to Achieve Goals: Good Progress towards PT goals: Progressing toward goals    Frequency  7X/week    PT Plan Current plan remains appropriate    Co-evaluation             End of Session Equipment Utilized During Treatment: Gait belt Activity  Tolerance: Patient limited by pain Patient left: in chair;with call bell/phone within reach     Time: 1421-1457 PT Time Calculation (min) (ACUTE ONLY): 36 min  Charges:  $Gait Training: 8-22 mins $Therapeutic Exercise: 8-22 mins                    G Codes:      Florestine Avers 01-Jun-2015, 4:52 PM  Joycelyn Rua, PTA pager [phone]

## 2015-05-19 NOTE — Progress Notes (Signed)
PT paged to see if they needed to see pt again before discharge

## 2015-05-19 NOTE — Care Management Note (Signed)
Case Management Note  Patient Details  Name: AMADEUS OYAMA MRN: [id number] Date of Birth: 10/28/1965  Subjective/Objective:                 S/p left total hip arthroplasty   Action/Plan: Set up with [facility] for HHPT by MD office. Spoke with patient, no change in discharge plan.Patient stated that he has a rolling walker and 3N1 at home and that his wife will be available to assist him after discharge.     Expected Discharge Date:                  Expected Discharge Plan:  Home w Home Health Services  In-House Referral:  NA  Discharge planning Services  CM Consult  Post Acute Care Choice:  Home Health Choice offered to:  Patient  DME Arranged:  N/A DME Agency:  NA  HH Arranged:  PT HH Agency:  [facility]  Status of Service:  Completed, signed off  Medicare Important Message Given:    Date Medicare IM Given:    Medicare IM give by:    Date Additional Medicare IM Given:    Additional Medicare Important Message give by:     If discussed at Long Length of Stay Meetings, dates discussed:    Additional Comments:  Monica Becton, RN 05/19/2015, 11:25 AM

## 2015-05-19 NOTE — Evaluation (Signed)
Occupational Therapy Evaluation and Discharge Patient Details Name: Paul Francis MRN: [id number] DOB: 10-07-1965 Today's Date: 05/19/2015    History of Present Illness 50 y.o. male admitted to [facility] on 05/18/15 for elective L THA posterior approach.  Pt with significant PMHx of AVN, COPD, anxiety, R hand fx, and left knee surgery (needles stuck in knee per chart).     Clinical Impression   This 50 yo male admitted and underwent above presents to acute OT with all education completed, no further OT needs, we will sign off.    Follow Up Recommendations  No OT follow up    Equipment Recommendations  3 in 1 bedside comode;Other (comment) (pt already received)       Precautions / Restrictions Precautions Precautions: Posterior Hip Precaution Comments: pt aware of precautions Required Braces or Orthoses: Other Brace/Splint Other Brace/Splint: hip abduction pillow for in the bed.  Restrictions Weight Bearing Restrictions: No LLE Weight Bearing: Weight bearing as tolerated      Mobility Bed Mobility Overal bed mobility: Needs Assistance Bed Mobility: Supine to Sit     Supine to sit: Min assist     General bed mobility comments: A for LLE only  Transfers Overall transfer level: Needs assistance Equipment used: Rolling walker (2 wheeled) Transfers: Sit to/from Stand Sit to Stand: Supervision                   ADL Overall ADL's : Needs assistance/impaired Eating/Feeding: Independent;Sitting   Grooming: Set up;Supervision/safety;Standing;Wash/dry hands   Upper Body Bathing: Set up;Sitting   Lower Body Bathing: Supervison/ safety;Set up;Adhering to hip precautions;With adaptive equipment;Sit to/from stand   Upper Body Dressing : Set up;Sitting   Lower Body Dressing: Supervision/safety;Set up;Adhering to hip precautions;With adaptive equipment;Sit to/from stand   Toilet Transfer: Supervision/safety;Ambulation;RW;BSC (over toilet)   Toileting- Clothing  Manipulation and Hygiene: Supervision/safety;Sit to/from stand   Tub/ Shower Transfer: Tub transfer;Supervision/safety;Adhering to hip precautions;Ambulation;Rolling walker;3 in 1   Functional mobility during ADLs: Supervision/safety                 Pertinent Vitals/Pain Pain Assessment: 0-10 Pain Score: 4  Pain Location: left LLE Pain Descriptors / Indicators: Aching;Sore;Operative site guarding Pain Intervention(s): Monitored during session;Repositioned     Hand Dominance Right   Extremity/Trunk Assessment Upper Extremity Assessment Upper Extremity Assessment: Overall WFL for tasks assessed           Communication Communication Communication: No difficulties   Cognition Arousal/Alertness: Awake/alert Behavior During Therapy: WFL for tasks assessed/performed Overall Cognitive Status: Within Functional Limits for tasks assessed                                Home Living Family/patient expects to be discharged to:: Private residence Living Arrangements: Spouse/significant other Available Help at Discharge: Family;Available 24 hours/day Type of Home: House Home Access: Stairs to enter [employer] of Steps: 1 (small stoop) Entrance Stairs-Rails: None Home Layout: One level     Bathroom Shower/Tub: IT trainer: Standard Bathroom Accessibility: Yes   Home Equipment: Bedside commode;Walker - 2 wheels          Prior Functioning/Environment Level of Independence: Independent             OT Diagnosis: Generalized weakness;Acute pain         OT Goals(Current goals can be found in the care plan section) Acute Rehab OT Goals Patient Stated Goal: home today or  tomorrow  OT Frequency:                End of Session Equipment Utilized During Treatment: Engineer, water Communication:  (NT: pt at a S level for mobilty)  Activity Tolerance: Patient tolerated treatment well Patient left: in  chair;with call bell/phone within reach   Time: 0830-0931 OT Time Calculation (min): 61 min Charges:  OT General Charges $OT Visit: 1 Procedure OT Evaluation $OT Eval Moderate Complexity: 1 Procedure OT Treatments $Self Care/Home Management : 38-52 mins  Reina Fuse [id number] 05/19/2015, 9:37 AM

## 2015-05-19 NOTE — Progress Notes (Signed)
     Subjective:  Patient reports pain as moderate.  He was able to get out of bed yesterday, is overall feeling reasonably well. He is not sure if he is comfortable be up for going home later today, but is considering it.  Objective:   VITALS:   Filed Vitals:   05/18/15 2041 05/19/15 0028 05/19/15 0120 05/19/15 0507  BP: 154/83 128/77  148/86  Pulse: 88 86  90  Temp: 99.3 F (37.4 C) 101.2 F (38.4 C) 99.8 F (37.7 C) 100.1 F (37.8 C)  TempSrc: Oral Oral  Oral  Resp: Weight:      SpO2: 100% 100%  96%    Neurologically intact A little bit of soft tissue swelling over the left thigh, surgical wounds are clean and covered, scant drainage on the dressing.  Lab Results  Component Value Date   WBC 9.4 05/19/2015   HGB 12.5* 05/19/2015   HCT 36.1* 05/19/2015   MCV 95.0 05/19/2015   PLT 192 05/19/2015   BMET    Component Value Date/Time   NA 136 05/19/2015 0653   K 4.3 05/19/2015 0653   CL 104 05/19/2015 0653   CO2 26 05/19/2015 0653   GLUCOSE 110* 05/19/2015 0653   BUN 11 05/19/2015 0653   CREATININE 1.11 05/19/2015 0653   CALCIUM 8.5* 05/19/2015 0653   GFRNONAA >60 05/19/2015 0653   GFRAA >60 05/19/2015 0653     Assessment/Plan: 1 Day Post-Op   Principal Problem:   Avascular necrosis of bone of left hip (HCC)   Advance diet Up with therapy Possible discharged later today versus tomorrow depending on symptoms and ambulatory function.   Paul Francis P 05/19/2015, 8:37 AM   Teryl Lucy, MD Cell [phone]

## 2015-07-07 ENCOUNTER — Other Ambulatory Visit: Payer: Self-pay | Admitting: Orthopedic Surgery

## 2015-07-20 NOTE — Pre-Procedure Instructions (Signed)
Paul BroachMarvin L Francis  07/20/2015      [id number] [place] - [address] [facility] [address] [address] [place] [id number] Phone: [phone] Fax: [phone]    Your procedure is scheduled on Tues, April 25 @ 10:45 AM  [facility] Admitting at 8:45 AM  Call this number if you have problems the morning of surgery:  [phone]   Remember:  Do not eat food or drink liquids after midnight.               No Goody's,BC's,Aleve,Aspirin,Ibuprofen,Motrin,Advil,Fish Oil,or any Herbal Medications.    Do not wear jewelry.  Do not wear lotions, powders, or colognes.               Men may shave face and neck.  Do not bring valuables to the hospital.  [facility] is not responsible for any belongings or valuables.  Contacts, dentures or bridgework may not be worn into surgery.  Leave your suitcase in the car.  After surgery it may be brought to your room.  For patients admitted to the hospital, discharge time will be determined by your treatment team.  Patients discharged the day of surgery will not be allowed to drive home.    Special instructions:  [facility] - Preparing for Surgery  Before surgery, you can play an important role.  Because skin is not sterile, your skin needs to be as free of germs as possible.  You can reduce the number of germs on you skin by washing with CHG (chlorahexidine gluconate) soap before surgery.  CHG is an antiseptic cleaner which kills germs and bonds with the skin to continue killing germs even after washing.  Please DO NOT use if you have an allergy to CHG or antibacterial soaps.  If your skin becomes reddened/irritated stop using the CHG and inform your nurse when you arrive at Short Stay.  Do not shave (including legs and underarms) for at least 48 hours prior to the first CHG shower.  You may shave your face.  Please follow these instructions carefully:   1.  Shower with CHG Soap the night  before surgery and the                                morning of Surgery.  2.  If you choose to wash your hair, wash your hair first as usual with your       normal shampoo.  3.  After you shampoo, rinse your hair and body thoroughly to remove the                      Shampoo.  4.  Use CHG as you would any other liquid soap.  You can apply chg directly       to the skin and wash gently with scrungie or a clean washcloth.  5.  Apply the CHG Soap to your body ONLY FROM THE NECK DOWN.        Do not use on open wounds or open sores.  Avoid contact with your eyes,       ears, mouth and genitals (private parts).  Wash genitals (private parts)       with your normal soap.  6.  Wash thoroughly, paying special attention to the area where your surgery  will be performed.  7.  Thoroughly rinse your body with warm water from the neck down.  8.  DO NOT shower/wash with your normal soap after using and rinsing off       the CHG Soap.  9.  Pat yourself dry with a clean towel.            10.  Wear clean pajamas.            11.  Place clean sheets on your bed the night of your first shower and do not        sleep with pets.  Day of Surgery  Do not apply any lotions/deoderants the morning of surgery.  Please wear clean clothes to the hospital/surgery center.    Please read over the following fact sheets that you were given. Pain Booklet, Coughing and Deep Breathing, MRSA Information and Surgical Site Infection Prevention

## 2015-07-21 ENCOUNTER — Encounter (HOSPITAL_COMMUNITY): Payer: Self-pay

## 2015-07-21 ENCOUNTER — Encounter (HOSPITAL_COMMUNITY)
Admission: RE | Admit: 2015-07-21 | Discharge: 2015-07-21 | Disposition: A | Discharge status: Home / Self Care | Payer: 59 | Source: Ambulatory Visit | Attending: Orthopedic Surgery | Admitting: Orthopedic Surgery

## 2015-07-21 DIAGNOSIS — M1611 Unilateral primary osteoarthritis, right hip: Secondary | ICD-10-CM | POA: Insufficient documentation

## 2015-07-21 DIAGNOSIS — Z01812 Encounter for preprocedural laboratory examination: Secondary | ICD-10-CM | POA: Diagnosis not present

## 2015-07-21 HISTORY — DX: Effusion, unspecified joint: M25.40

## 2015-07-21 HISTORY — DX: Personal history of other diseases of the respiratory system: Z87.09

## 2015-07-21 HISTORY — DX: Pain in unspecified joint: M25.50

## 2015-07-21 HISTORY — DX: Other complications of anesthesia, initial encounter: T88.59XA

## 2015-07-21 HISTORY — DX: Dorsalgia, unspecified: M54.9

## 2015-07-21 HISTORY — DX: Unspecified osteoarthritis, unspecified site: M19.90

## 2015-07-21 HISTORY — DX: Adverse effect of unspecified anesthetic, initial encounter: T41.45XA

## 2015-07-21 LAB — BASIC METABOLIC PANEL
Anion gap: 12 (ref 5–15)
BUN: 13 mg/dL (ref 6–20)
CALCIUM: 9.5 mg/dL (ref 8.9–10.3)
CO2: 22 mmol/L (ref 22–32)
CREATININE: 1.04 mg/dL (ref 0.61–1.24)
Chloride: 103 mmol/L (ref 101–111)
Glucose, Bld: 99 mg/dL (ref 65–99)
Potassium: 4.5 mmol/L (ref 3.5–5.1)
SODIUM: 137 mmol/L (ref 135–145)

## 2015-07-21 LAB — SURGICAL PCR SCREEN
MRSA, PCR: NEGATIVE
Staphylococcus aureus: NEGATIVE

## 2015-07-21 LAB — CBC
HCT: 44.8 % (ref 39.0–52.0)
Hemoglobin: 15.1 g/dL (ref 13.0–17.0)
MCH: 31.7 pg (ref 26.0–34.0)
MCHC: 33.7 g/dL (ref 30.0–36.0)
MCV: 94.1 fL (ref 78.0–100.0)
PLATELETS: 243 10*3/uL (ref 150–400)
RBC: 4.76 MIL/uL (ref 4.22–5.81)
RDW: 14.6 % (ref 11.5–15.5)
WBC: 9.7 10*3/uL (ref 4.0–10.5)

## 2015-07-21 NOTE — Progress Notes (Addendum)
Cardiologist denies   Medical Md [place] Dr[place]us[place]  Echo denies  Stress test denies  Heart cath denies  EKG in epic from 05-10-15  CXR denies in past yr

## 2015-08-02 MED ORDER — CEFAZOLIN SODIUM-DEXTROSE 2-4 GM/100ML-% IV SOLN
2.0000 g | INTRAVENOUS | Status: AC
  Administered 2015-08-03: 2 g via INTRAVENOUS
  Filled 2015-08-02: qty 100

## 2015-08-02 NOTE — Anesthesia Preprocedure Evaluation (Addendum)
Anesthesia Evaluation  Patient identified by MRN, date of birth, ID band Patient awake    Reviewed: Allergy & Precautions, NPO status , Patient's Chart, lab work & pertinent test results  Airway Mallampati: II  TM Distance: >3 FB     Dental  (+) Teeth Intact   Pulmonary neg shortness of breath, neg sleep apnea, COPD (no treatment), neg recent URI, Current Smoker (35 pack yrs), neg PE   breath sounds clear to auscultation- rhonchi       Cardiovascular Exercise Tolerance: Good (-) hypertensionnegative cardio ROS   Rhythm:Regular     Neuro/Psych Anxiety negative neurological ROS     GI/Hepatic negative GI ROS, Neg liver ROS,   Endo/Other  negative endocrine ROS  Renal/GU negative Renal ROS     Musculoskeletal  (+) Arthritis , Osteoarthritis,    Abdominal   Peds  Hematology negative hematology ROS (+) Plt 243k on 07/21/15  Xarelto use   Anesthesia Other Findings   Reproductive/Obstetrics                           Anesthesia Physical Anesthesia Plan  ASA: II  Anesthesia Plan: MAC and Spinal   Post-op Pain Management:    Induction: Intravenous  Airway Management Planned: Nasal Cannula, Natural Airway and Simple Face Mask  Additional Equipment: None  Intra-op Plan:   Post-operative Plan:   Informed Consent: I have reviewed the patients History and Physical, chart, labs and discussed the procedure including the risks, benefits and alternatives for the proposed anesthesia with the patient or authorized representative who has indicated his/her understanding and acceptance.   Dental advisory given  Plan Discussed with: CRNA and Surgeon  Anesthesia Plan Comments: (Discussed risks and benefits of and differences between spinal and general. Discussed risks of spinal including headache, backache, failure, bleeding, infection, and nerve damage. Patient consents to spinal. Questions answered.  Coagulation studies and platelet count acceptable.)        Anesthesia Quick Evaluation

## 2015-08-03 ENCOUNTER — Inpatient Hospital Stay (HOSPITAL_COMMUNITY): Payer: 59 | Admitting: Anesthesiology

## 2015-08-03 ENCOUNTER — Inpatient Hospital Stay (HOSPITAL_COMMUNITY): Payer: 59

## 2015-08-03 ENCOUNTER — Encounter (HOSPITAL_COMMUNITY)
Admission: RE | Disposition: A | Discharge status: Home Health | Payer: Self-pay | Source: Ambulatory Visit | Attending: Orthopedic Surgery

## 2015-08-03 ENCOUNTER — Encounter (HOSPITAL_COMMUNITY): Payer: Self-pay | Admitting: *Deleted

## 2015-08-03 ENCOUNTER — Inpatient Hospital Stay (HOSPITAL_COMMUNITY)
Admission: RE | Admit: 2015-08-03 | Discharge: 2015-08-04 | DRG: 470 | Disposition: A | Discharge status: Home Health | Payer: 59 | Source: Ambulatory Visit | Attending: Orthopedic Surgery | Admitting: Orthopedic Surgery

## 2015-08-03 DIAGNOSIS — F17210 Nicotine dependence, cigarettes, uncomplicated: Secondary | ICD-10-CM | POA: Diagnosis present

## 2015-08-03 DIAGNOSIS — Z79899 Other long term (current) drug therapy: Secondary | ICD-10-CM | POA: Diagnosis not present

## 2015-08-03 DIAGNOSIS — M87851 Other osteonecrosis, right femur: Principal | ICD-10-CM | POA: Diagnosis present

## 2015-08-03 DIAGNOSIS — M87051 Idiopathic aseptic necrosis of right femur: Secondary | ICD-10-CM | POA: Diagnosis present

## 2015-08-03 DIAGNOSIS — Z96642 Presence of left artificial hip joint: Secondary | ICD-10-CM | POA: Diagnosis present

## 2015-08-03 DIAGNOSIS — F419 Anxiety disorder, unspecified: Secondary | ICD-10-CM | POA: Diagnosis present

## 2015-08-03 DIAGNOSIS — M1610 Unilateral primary osteoarthritis, unspecified hip: Secondary | ICD-10-CM

## 2015-08-03 DIAGNOSIS — Z96649 Presence of unspecified artificial hip joint: Secondary | ICD-10-CM

## 2015-08-03 DIAGNOSIS — J449 Chronic obstructive pulmonary disease, unspecified: Secondary | ICD-10-CM | POA: Diagnosis present

## 2015-08-03 DIAGNOSIS — M25551 Pain in right hip: Secondary | ICD-10-CM | POA: Diagnosis present

## 2015-08-03 HISTORY — DX: Idiopathic aseptic necrosis of right femur: M87.051

## 2015-08-03 HISTORY — PX: TOTAL HIP ARTHROPLASTY: SHX124

## 2015-08-03 SURGERY — ARTHROPLASTY, HIP, TOTAL,POSTERIOR APPROACH
Anesthesia: Monitor Anesthesia Care | Laterality: Right

## 2015-08-03 MED ORDER — POTASSIUM CHLORIDE IN NACL 20-0.45 MEQ/L-% IV SOLN
INTRAVENOUS | Status: DC
  Administered 2015-08-03: 22:00:00 via INTRAVENOUS
  Filled 2015-08-03 (×4): qty 1000

## 2015-08-03 MED ORDER — FENTANYL CITRATE (PF) 250 MCG/5ML IJ SOLN
INTRAMUSCULAR | Status: AC
  Filled 2015-08-03: qty 5

## 2015-08-03 MED ORDER — BACLOFEN 10 MG PO TABS: 10.0000 mg | ORAL_TABLET | Freq: Three times a day (TID) | ORAL | Status: AC

## 2015-08-03 MED ORDER — SENNA 8.6 MG PO TABS
1.0000 | ORAL_TABLET | Freq: Two times a day (BID) | ORAL | Status: DC
Start: 2015-08-03 — End: 2015-08-04
  Administered 2015-08-03 – 2015-08-04 (×3): 8.6 mg via ORAL
  Filled 2015-08-03 (×3): qty 1

## 2015-08-03 MED ORDER — PHENOL 1.4 % MT LIQD: 1.0000 | OROMUCOSAL | Status: DC | PRN

## 2015-08-03 MED ORDER — ZOLPIDEM TARTRATE 5 MG PO TABS: 5.0000 mg | ORAL_TABLET | Freq: Every evening | ORAL | Status: DC | PRN

## 2015-08-03 MED ORDER — RIVAROXABAN 10 MG PO TABS
10.0000 mg | ORAL_TABLET | Freq: Every day | ORAL | Status: DC
  Administered 2015-08-04: 10 mg via ORAL
  Filled 2015-08-03: qty 1

## 2015-08-03 MED ORDER — FENTANYL CITRATE (PF) 250 MCG/5ML IJ SOLN
INTRAMUSCULAR | Status: DC | PRN
Start: 2015-08-03 — End: 2015-08-03
  Administered 2015-08-03: 100 ug via INTRAVENOUS
  Administered 2015-08-03: 50 ug via INTRAVENOUS

## 2015-08-03 MED ORDER — LACTATED RINGERS IV SOLN
INTRAVENOUS | Status: DC | PRN
  Administered 2015-08-03 (×2): via INTRAVENOUS

## 2015-08-03 MED ORDER — POLYETHYLENE GLYCOL 3350 17 G PO PACK: 17.0000 g | PACK | Freq: Every day | ORAL | Status: DC | PRN

## 2015-08-03 MED ORDER — METHOCARBAMOL 500 MG PO TABS
500.0000 mg | ORAL_TABLET | Freq: Four times a day (QID) | ORAL | Status: DC | PRN
  Administered 2015-08-03 – 2015-08-04 (×3): 500 mg via ORAL
  Filled 2015-08-03 (×3): qty 1

## 2015-08-03 MED ORDER — MAGNESIUM CITRATE PO SOLN: 1.0000 | Freq: Once | ORAL | Status: DC | PRN

## 2015-08-03 MED ORDER — ACETAMINOPHEN 325 MG PO TABS: 650.0000 mg | ORAL_TABLET | Freq: Four times a day (QID) | ORAL | Status: DC | PRN

## 2015-08-03 MED ORDER — PROPOFOL 1000 MG/100ML IV EMUL
INTRAVENOUS | Status: AC
  Filled 2015-08-03: qty 200

## 2015-08-03 MED ORDER — DEXAMETHASONE SODIUM PHOSPHATE 10 MG/ML IJ SOLN
10.0000 mg | Freq: Once | INTRAMUSCULAR | Status: AC
  Administered 2015-08-04: 10 mg via INTRAVENOUS
  Filled 2015-08-03: qty 1

## 2015-08-03 MED ORDER — DIPHENHYDRAMINE HCL 50 MG/ML IJ SOLN
INTRAMUSCULAR | Status: DC | PRN
Start: 2015-08-03 — End: 2015-08-03
  Administered 2015-08-03: 25 mg via INTRAVENOUS

## 2015-08-03 MED ORDER — PHENYLEPHRINE HCL 10 MG/ML IJ SOLN
INTRAMUSCULAR | Status: DC | PRN
  Administered 2015-08-03 (×2): 40 ug via INTRAVENOUS
  Administered 2015-08-03 (×2): 80 ug via INTRAVENOUS
  Administered 2015-08-03: 40 ug via INTRAVENOUS
  Administered 2015-08-03: 80 ug via INTRAVENOUS
  Administered 2015-08-03: 40 ug via INTRAVENOUS

## 2015-08-03 MED ORDER — METOCLOPRAMIDE HCL 5 MG/ML IJ SOLN
5.0000 mg | Freq: Three times a day (TID) | INTRAMUSCULAR | Status: DC | PRN
Start: 2015-08-03 — End: 2015-08-04

## 2015-08-03 MED ORDER — MIDAZOLAM HCL 2 MG/2ML IJ SOLN
INTRAMUSCULAR | Status: AC
  Filled 2015-08-03: qty 2

## 2015-08-03 MED ORDER — EPHEDRINE SULFATE 50 MG/ML IJ SOLN
INTRAMUSCULAR | Status: AC
  Filled 2015-08-03: qty 1

## 2015-08-03 MED ORDER — SODIUM CHLORIDE 0.9 % IJ SOLN
INTRAMUSCULAR | Status: AC
  Filled 2015-08-03: qty 10

## 2015-08-03 MED ORDER — RIVAROXABAN 10 MG PO TABS: 10.0000 mg | ORAL_TABLET | Freq: Every day | ORAL | Status: AC

## 2015-08-03 MED ORDER — PHENYLEPHRINE 40 MCG/ML (10ML) SYRINGE FOR IV PUSH (FOR BLOOD PRESSURE SUPPORT)
PREFILLED_SYRINGE | INTRAVENOUS | Status: AC
  Filled 2015-08-03: qty 10

## 2015-08-03 MED ORDER — LIDOCAINE HCL (CARDIAC) 20 MG/ML IV SOLN
INTRAVENOUS | Status: DC | PRN
  Administered 2015-08-03: 60 mg via INTRATRACHEAL

## 2015-08-03 MED ORDER — ONDANSETRON HCL 4 MG PO TABS: 4.0000 mg | ORAL_TABLET | Freq: Three times a day (TID) | ORAL | Status: AC | PRN

## 2015-08-03 MED ORDER — METOCLOPRAMIDE HCL 5 MG PO TABS: 5.0000 mg | ORAL_TABLET | Freq: Three times a day (TID) | ORAL | Status: DC | PRN

## 2015-08-03 MED ORDER — MIDAZOLAM HCL 5 MG/5ML IJ SOLN
INTRAMUSCULAR | Status: DC | PRN
Start: 2015-08-03 — End: 2015-08-03
  Administered 2015-08-03: 2 mg via INTRAVENOUS

## 2015-08-03 MED ORDER — FENTANYL CITRATE (PF) 100 MCG/2ML IJ SOLN: 25.0000 ug | INTRAMUSCULAR | Status: DC | PRN

## 2015-08-03 MED ORDER — ONDANSETRON HCL 4 MG/2ML IJ SOLN
INTRAMUSCULAR | Status: AC
  Filled 2015-08-03: qty 2

## 2015-08-03 MED ORDER — PROPOFOL 500 MG/50ML IV EMUL
INTRAVENOUS | Status: AC
  Filled 2015-08-03: qty 100

## 2015-08-03 MED ORDER — CEFAZOLIN SODIUM-DEXTROSE 2-4 GM/100ML-% IV SOLN
2.0000 g | Freq: Four times a day (QID) | INTRAVENOUS | Status: AC
  Administered 2015-08-03 (×2): 2 g via INTRAVENOUS
  Filled 2015-08-03 (×2): qty 100

## 2015-08-03 MED ORDER — METHOCARBAMOL 1000 MG/10ML IJ SOLN
500.0000 mg | Freq: Four times a day (QID) | INTRAMUSCULAR | Status: DC | PRN
  Filled 2015-08-03: qty 5

## 2015-08-03 MED ORDER — ACETAMINOPHEN 650 MG RE SUPP: 650.0000 mg | Freq: Four times a day (QID) | RECTAL | Status: DC | PRN

## 2015-08-03 MED ORDER — ALUM & MAG HYDROXIDE-SIMETH 200-200-20 MG/5ML PO SUSP: 30.0000 mL | ORAL | Status: DC | PRN

## 2015-08-03 MED ORDER — BISACODYL 10 MG RE SUPP: 10.0000 mg | Freq: Every day | RECTAL | Status: DC | PRN

## 2015-08-03 MED ORDER — DOCUSATE SODIUM 100 MG PO CAPS
100.0000 mg | ORAL_CAPSULE | Freq: Two times a day (BID) | ORAL | Status: DC
  Administered 2015-08-03 – 2015-08-04 (×3): 100 mg via ORAL
  Filled 2015-08-03 (×3): qty 1

## 2015-08-03 MED ORDER — SUCCINYLCHOLINE CHLORIDE 20 MG/ML IJ SOLN
INTRAMUSCULAR | Status: AC
  Filled 2015-08-03: qty 1

## 2015-08-03 MED ORDER — OXYCODONE HCL 5 MG PO TABS
ORAL_TABLET | ORAL | Status: AC
  Filled 2015-08-03: qty 2

## 2015-08-03 MED ORDER — LIDOCAINE HCL (CARDIAC) 20 MG/ML IV SOLN
INTRAVENOUS | Status: AC
  Filled 2015-08-03: qty 5

## 2015-08-03 MED ORDER — PROPOFOL 10 MG/ML IV BOLUS
INTRAVENOUS | Status: DC | PRN
Start: 2015-08-03 — End: 2015-08-03
  Administered 2015-08-03: 40 mg via INTRAVENOUS

## 2015-08-03 MED ORDER — BUPIVACAINE HCL (PF) 0.25 % IJ SOLN
INTRAMUSCULAR | Status: AC
  Filled 2015-08-03: qty 30

## 2015-08-03 MED ORDER — GLYCOPYRROLATE 0.2 MG/ML IJ SOLN
INTRAMUSCULAR | Status: DC | PRN
  Administered 2015-08-03: 0.2 mg via INTRAVENOUS

## 2015-08-03 MED ORDER — PROPOFOL 10 MG/ML IV BOLUS
INTRAVENOUS | Status: AC
  Filled 2015-08-03: qty 20

## 2015-08-03 MED ORDER — GLYCOPYRROLATE 0.2 MG/ML IJ SOLN
INTRAMUSCULAR | Status: AC
  Filled 2015-08-03: qty 2

## 2015-08-03 MED ORDER — OXYCODONE-ACETAMINOPHEN 10-325 MG PO TABS: 1.0000 | ORAL_TABLET | Freq: Four times a day (QID) | ORAL | Status: AC | PRN

## 2015-08-03 MED ORDER — 0.9 % SODIUM CHLORIDE (POUR BTL) OPTIME
TOPICAL | Status: DC | PRN
  Administered 2015-08-03: 1000 mL

## 2015-08-03 MED ORDER — MENTHOL 3 MG MT LOZG: 1.0000 | LOZENGE | OROMUCOSAL | Status: DC | PRN

## 2015-08-03 MED ORDER — PROPOFOL 500 MG/50ML IV EMUL
INTRAVENOUS | Status: DC | PRN
  Administered 2015-08-03: 75 ug/kg/min via INTRAVENOUS

## 2015-08-03 MED ORDER — ARTIFICIAL TEARS OP OINT
TOPICAL_OINTMENT | OPHTHALMIC | Status: AC
  Filled 2015-08-03: qty 3.5

## 2015-08-03 MED ORDER — ONDANSETRON HCL 4 MG PO TABS: 4.0000 mg | ORAL_TABLET | Freq: Four times a day (QID) | ORAL | Status: DC | PRN

## 2015-08-03 MED ORDER — DEXTROSE 5 % IV SOLN
10.0000 mg | INTRAVENOUS | Status: DC | PRN
  Administered 2015-08-03: 15 ug/min via INTRAVENOUS

## 2015-08-03 MED ORDER — BUPIVACAINE HCL (PF) 0.5 % IJ SOLN
INTRAMUSCULAR | Status: DC | PRN
Start: 2015-08-03 — End: 2015-08-03
  Administered 2015-08-03: 3 mL via INTRATHECAL

## 2015-08-03 MED ORDER — HYDROMORPHONE HCL 1 MG/ML IJ SOLN
0.5000 mg | INTRAMUSCULAR | Status: DC | PRN
  Administered 2015-08-03 – 2015-08-04 (×3): 0.5 mg via INTRAVENOUS
  Filled 2015-08-03 (×3): qty 1

## 2015-08-03 MED ORDER — DIPHENHYDRAMINE HCL 12.5 MG/5ML PO ELIX: 12.5000 mg | ORAL_SOLUTION | ORAL | Status: DC | PRN

## 2015-08-03 MED ORDER — SENNA-DOCUSATE SODIUM 8.6-50 MG PO TABS: 2.0000 | ORAL_TABLET | Freq: Every day | ORAL | Status: AC

## 2015-08-03 MED ORDER — ROCURONIUM BROMIDE 50 MG/5ML IV SOLN
INTRAVENOUS | Status: AC
  Filled 2015-08-03: qty 1

## 2015-08-03 MED ORDER — OXYCODONE HCL 5 MG PO TABS
5.0000 mg | ORAL_TABLET | ORAL | Status: DC | PRN
  Administered 2015-08-03 – 2015-08-04 (×5): 10 mg via ORAL
  Administered 2015-08-04: 5 mg via ORAL
  Filled 2015-08-03 (×2): qty 2
  Filled 2015-08-03: qty 1
  Filled 2015-08-03 (×2): qty 2

## 2015-08-03 MED ORDER — ONDANSETRON HCL 4 MG/2ML IJ SOLN: 4.0000 mg | Freq: Four times a day (QID) | INTRAMUSCULAR | Status: DC | PRN

## 2015-08-03 SURGICAL SUPPLY — 61 items
BIT DRILL 5/64X5 DISP (BIT) ×3 IMPLANT
BLADE SAW SAG 73X25 THK (BLADE) ×2
BLADE SAW SGTL 73X25 THK (BLADE) ×1 IMPLANT
BRUSH FEMORAL CANAL (MISCELLANEOUS) IMPLANT
CAPT HIP TOTAL 2 ×2 IMPLANT
CLOSURE STERI-STRIP 1/2X4 (GAUZE/BANDAGES/DRESSINGS) ×1
CLSR STERI-STRIP ANTIMIC 1/2X4 (GAUZE/BANDAGES/DRESSINGS) ×3 IMPLANT
COVER SURGICAL LIGHT HANDLE (MISCELLANEOUS) ×3 IMPLANT
DRAPE INCISE IOBAN 66X45 STRL (DRAPES) IMPLANT
DRAPE ORTHO SPLIT 77X108 STRL (DRAPES) ×6
DRAPE PROXIMA HALF (DRAPES) ×3 IMPLANT
DRAPE SURG ORHT 6 SPLT 77X108 (DRAPES) ×2 IMPLANT
DRAPE U-SHAPE 47X51 STRL (DRAPES) ×3 IMPLANT
DRSG MEPILEX BORDER 4X12 (GAUZE/BANDAGES/DRESSINGS) IMPLANT
DRSG MEPILEX BORDER 4X8 (GAUZE/BANDAGES/DRESSINGS) ×2 IMPLANT
DURAPREP 26ML APPLICATOR (WOUND CARE) ×5 IMPLANT
ELECT CAUTERY BLADE 6.4 (BLADE) ×3 IMPLANT
ELECT REM PT RETURN 9FT ADLT (ELECTROSURGICAL) ×3
ELECTRODE REM PT RTRN 9FT ADLT (ELECTROSURGICAL) ×1 IMPLANT
GLOVE BIOGEL PI IND STRL 8 (GLOVE) ×1 IMPLANT
GLOVE BIOGEL PI INDICATOR 8 (GLOVE) ×2
GLOVE BIOGEL PI ORTHO PRO SZ8 (GLOVE) ×2
GLOVE ORTHO TXT STRL SZ7.5 (GLOVE) ×3 IMPLANT
GLOVE PI ORTHO PRO STRL SZ8 (GLOVE) ×1 IMPLANT
GLOVE SURG ORTHO 8.0 STRL STRW (GLOVE) ×3 IMPLANT
GOWN STRL REUS W/ TWL XL LVL3 (GOWN DISPOSABLE) ×1 IMPLANT
GOWN STRL REUS W/TWL 2XL LVL3 (GOWN DISPOSABLE) ×3 IMPLANT
GOWN STRL REUS W/TWL XL LVL3 (GOWN DISPOSABLE) ×3
HANDPIECE INTERPULSE COAX TIP (DISPOSABLE)
HOOD PEEL AWAY FACE SHEILD DIS (HOOD) ×6 IMPLANT
KIT BASIN OR (CUSTOM PROCEDURE TRAY) ×3 IMPLANT
KIT ROOM TURNOVER OR (KITS) ×3 IMPLANT
MANIFOLD NEPTUNE II (INSTRUMENTS) ×3 IMPLANT
NDL SAFETY ECLIPSE 18X1.5 (NEEDLE) ×1 IMPLANT
NDL SUT .5 MAYO 1.404X.05X (NEEDLE) ×1 IMPLANT
NEEDLE HYPO 18GX1.5 SHARP (NEEDLE) ×3
NEEDLE MAYO TAPER (NEEDLE) ×3
NS IRRIG 1000ML POUR BTL (IV SOLUTION) ×3 IMPLANT
PACK TOTAL JOINT (CUSTOM PROCEDURE TRAY) ×3 IMPLANT
PAD ARMBOARD 7.5X6 YLW CONV (MISCELLANEOUS) ×6 IMPLANT
PILLOW ABDUCTION HIP (SOFTGOODS) ×3 IMPLANT
PRESSURIZER FEMORAL UNIV (MISCELLANEOUS) IMPLANT
RETRIEVER SUT HEWSON (MISCELLANEOUS) ×3 IMPLANT
SET HNDPC FAN SPRY TIP SCT (DISPOSABLE) IMPLANT
SPONGE LAP 4X18 X RAY DECT (DISPOSABLE) IMPLANT
SUCTION FRAZIER HANDLE 10FR (MISCELLANEOUS) ×2
SUCTION TUBE FRAZIER 10FR DISP (MISCELLANEOUS) ×1 IMPLANT
SUT FIBERWIRE #2 38 REV NDL BL (SUTURE) ×9
SUT MNCRL AB 4-0 PS2 18 (SUTURE) ×3 IMPLANT
SUT VIC AB 0 CT1 27 (SUTURE) ×6
SUT VIC AB 0 CT1 27XBRD ANBCTR (SUTURE) ×1 IMPLANT
SUT VIC AB 2-0 CT1 27 (SUTURE) ×3
SUT VIC AB 2-0 CT1 TAPERPNT 27 (SUTURE) ×1 IMPLANT
SUT VIC AB 3-0 SH 8-18 (SUTURE) ×3 IMPLANT
SUTURE FIBERWR#2 38 REV NDL BL (SUTURE) ×3 IMPLANT
SYR CONTROL 10ML LL (SYRINGE) ×3 IMPLANT
TOWEL OR 17X24 6PK STRL BLUE (TOWEL DISPOSABLE) ×3 IMPLANT
TOWEL OR 17X26 10 PK STRL BLUE (TOWEL DISPOSABLE) ×3 IMPLANT
TOWER CARTRIDGE SMART MIX (DISPOSABLE) IMPLANT
TRAY FOLEY CATH 14FR (SET/KITS/TRAYS/PACK) IMPLANT
WATER STERILE IRR 1000ML POUR (IV SOLUTION) ×2 IMPLANT

## 2015-08-03 NOTE — Transfer of Care (Signed)
Immediate Anesthesia Transfer of Care Note  Patient: Paul Francis  Procedure(s) Performed: Procedure(s): TOTAL HIP ARTHROPLASTY (Right)  Patient Location: PACU  Anesthesia Type:Spinal  Level of Consciousness: awake, alert  and oriented  Airway & Oxygen Therapy: Patient Spontanous Breathing and Patient connected to nasal cannula oxygen  Post-op Assessment: Report given to RN and Post -op Vital signs reviewed and stable  Post vital signs: Reviewed and stable  Last Vitals:  Filed Vitals:   08/03/15 0602  BP: 132/85  Pulse: 68  Temp: 36.7 C  Resp: 20    Complications: No apparent anesthesia complications

## 2015-08-03 NOTE — Anesthesia Procedure Notes (Addendum)
Procedure Name: MAC Date/Time: 08/03/2015 7:35 AM Performed by: Marena ChancyBECKNER, ZACHARY S Pre-anesthesia Checklist: Patient identified, Timeout performed, Emergency Drugs available, Suction available and Patient being monitored Patient Re-evaluated:Patient Re-evaluated prior to inductionOxygen Delivery Method: Nasal cannula Intubation Type: IV induction   Spinal Patient location during procedure: OR Staffing Anesthesiologist: Cecile HearingURK, Kalista Laguardia EDWARD Performed by: anesthesiologist  Preanesthetic Checklist Completed: patient identified, surgical consent, pre-op evaluation, timeout performed, IV checked, risks and benefits discussed and monitors and equipment checked Spinal Block Patient position: sitting Prep: site prepped and draped and DuraPrep Patient monitoring: continuous pulse ox and blood pressure Approach: midline Location: L3-4 Needle Needle type: Pencan  Needle gauge: 24 G Additional Notes Functioning IV was confirmed and monitors were applied. Sterile prep and drape, including hand hygiene, mask and sterile gloves were used. The patient was positioned and the spine was prepped. The skin was anesthetized with lidocaine.  Free flow of clear CSF was obtained prior to injecting local anesthetic into the CSF.  The spinal needle aspirated freely following injection.  The needle was carefully withdrawn.  The patient tolerated the procedure well. Consent was obtained prior to procedure with all questions answered and concerns addressed. Risks including but not limited to bleeding, infection, nerve damage, paralysis, failed block, inadequate analgesia, allergic reaction, high spinal, itching and headache were discussed and the patient wished to proceed.   Arrie AranStephen Veda Arrellano, MD

## 2015-08-03 NOTE — Op Note (Signed)
08/03/2015  9:22 AM  PATIENT:  Paul Francis   MRN: [id number]  PRE-OPERATIVE DIAGNOSIS:  Avascular necrosis of bone of right hip ([place])  POST-OPERATIVE DIAGNOSIS:  Avascular necrosis of bone of right hip ([facility])  PROCEDURE:  Procedure(s): TOTAL HIP ARTHROPLASTY  PREOPERATIVE INDICATIONS:    Paul Francis is an 50 y.o. male who has a diagnosis of Avascular necrosis of bone of right hip ([place]) and elected for surgical management after failing conservative treatment.  The risks benefits and alternatives were discussed with the patient including but not limited to the risks of nonoperative treatment, versus surgical intervention including infection, bleeding, nerve injury, periprosthetic fracture, the need for revision surgery, dislocation, leg length discrepancy, blood clots, cardiopulmonary complications, morbidity, mortality, among others, and they were willing to proceed.     OPERATIVE REPORT     SURGEON:  Marchia Bond, MD    ASSISTANT:  Joya Gaskins, OPA-C  (Present throughout the entire procedure,  necessary for completion of procedure in a timely manner, assisting with retraction, instrumentation, and closure)     ANESTHESIA:  Spinal    COMPLICATIONS:  None.     UNIQUE ASPECTS OF THE CASE:  The acetabulum may have an element of dysplasia, as it was somewhat deficient superiorly and anteriorly, but I did match the anterior rim during placement of the cup, and there was just a very small sliver of bone uncovered inferiorly and posteriorly. The cup grip was excellent, and I did not need an augmentation screw. The hip had excellent posterior stability, even with a neutral liner, and the neutral liner prevented posterior impingement. The leg lengths were somewhat short on the right compared to the left before the operation, and there was a small amount of collapse on the femoral head found intraoperatively.  COMPONENTS:  [employer] fit femur size 7 with a 36 mm + 5 head ball and  a gription acetabular shell size 54 with an apex hole eliminator and a +4 neutral polyethylene liner.    PROCEDURE IN DETAIL:   The patient was met in the holding area and  identified.  The appropriate hip was identified and marked at the operative site.  The patient was then transported to the OR  and  placed under anesthesia.  At that point, the patient was  placed in the lateral decubitus position with the operative side up and  secured to the operating room table and all bony prominences padded.     The operative lower extremity was prepped from the iliac crest to the distal leg.  Sterile draping was performed.  Time out was performed prior to incision.      A routine posterolateral approach was utilized via sharp dissection  carried down to the subcutaneous tissue.  Gross bleeders were Bovie coagulated.  The iliotibial band was identified and incised along the length of the skin incision.  Self-retaining retractors were  inserted.  With the hip internally rotated, the short external rotators  were identified. The piriformis and capsule was tagged with FiberWire, and the hip capsule released in a T-type fashion.  The femoral neck was exposed, and I resected the femoral neck using the appropriate jig. This was performed at approximately a thumb's breadth above the lesser trochanter.    I then exposed the deep acetabulum, cleared out any tissue including the ligamentum teres.  A wing retractor was placed.  After adequate visualization, I excised the labrum, and then sequentially reamed.  I placed the trial acetabulum, which  seated nicely, and then impacted the real cup into place.  Appropriate version and inclination was confirmed clinically matching their bony anatomy, and also with the use of the jig.  A trial polyethylene liner was placed and the wing retractor removed.    I then prepared the proximal femur using the cookie-cutter, the lateralizing reamer, and then sequentially reamed and  broached.  A trial broach, neck, and head was utilized, and I reduced the hip and it was found to have excellent stability with functional range of motion. The trial components were then removed, and the real polyethylene liner was placed.  I then impacted the real femoral prosthesis into place into the appropriate version, slightly anteverted to the normal anatomy, and I impacted the real head ball into place. The hip was then reduced and taken through functional range of motion and found to have excellent stability. Leg lengths were restored.  I then used a 2 mm drill bits to pass the FiberWire suture from the capsule and piriformis through the greater trochanter, and secured this. Excellent posterior capsular repair was achieved. I also closed the T in the capsule.  I then irrigated the hip copiously again with pulse lavage, and repaired the fascia with Vicryl, followed by Vicryl for the subcutaneous tissue, Monocryl for the skin, Steri-Strips and sterile gauze. The wounds were injected. The patient was then awakened and returned to PACU in stable and satisfactory condition. There were no complications.  Marchia Bond, MD Orthopedic Surgeon [phone]   08/03/2015 9:22 AM

## 2015-08-03 NOTE — Anesthesia Postprocedure Evaluation (Signed)
Anesthesia Post Note  Patient: Paul Francis  Procedure(s) Performed: Procedure(s) (LRB): TOTAL HIP ARTHROPLASTY (Right)  Patient location during evaluation: PACU Anesthesia Type: Spinal and MAC Level of consciousness: oriented, awake and alert, awake and patient cooperative Pain management: pain level controlled Vital Signs Assessment: post-procedure vital signs reviewed and stable Respiratory status: spontaneous breathing, respiratory function stable and patient connected to nasal cannula oxygen Cardiovascular status: blood pressure returned to baseline and stable Postop Assessment: no headache, no backache, spinal receding, adequate PO intake, no signs of nausea or vomiting and patient able to bend at knees Anesthetic complications: no    Last Vitals:  Filed Vitals:   08/03/15 1215 08/03/15 1240  BP: 105/93 142/81  Pulse: 68 70  Temp: 36.3 C 37.1 C  Resp: 13 14    Last Pain:  Filed Vitals:   08/03/15 1430  PainSc: 5                  Cecile HearingStephen Edward Aamani Moose

## 2015-08-03 NOTE — H&P (Signed)
PREOPERATIVE H&P  Chief Complaint: DJD RIGHT HIP  HPI: Paul Francis is a 50 y.o. male who presents for preoperative history and physical with a diagnosis of DJD RIGHT HIP. Symptoms are rated as moderate to severe, and have been worsening.  This is significantly impairing activities of daily living.  He has elected for surgical management.   He has failed injections, activity modification, anti-inflammatories, and assistive devices.  Preoperative X-rays demonstrate end stage degenerative changes with osteophyte formation, loss of joint space, subchondral sclerosis.  Excellent result from other side.   Past Medical History  Diagnosis Date  . Anxiety   . Complication of anesthesia     states eyes were burning and had to be washed out with saline while in recovery  . COPD (chronic obstructive pulmonary disease) ([facility])     "inital" but no treatment  . History of bronchitis > 2 yrs ago  . Arthritis   . Joint pain   . Joint swelling   . Back pain     DDD   Past Surgical History  Procedure Laterality Date  . Knee surgery  at age 50    needle stuck in knee-  . Total hip arthroplasty Left 05/18/2015    Procedure: LEFT TOTAL HIP ARTHROPLASTY;  Surgeon: Teryl LucyJoshua Armonee Bojanowski, MD;  Location: [facility] OR;  Service: Orthopedics;  Laterality: Left;   Social History   Social History  . Marital Status: Married    Spouse Name: N/A  . Number of Children: N/A  . Years of Education: N/A   Social History Main Topics  . Smoking status: Current Every Day Smoker -- 1.00 packs/day for 35 years  . Smokeless tobacco: None  . Alcohol Use: Yes     Comment: daily  . Drug Use: No  . Sexual Activity: Not Asked   Other Topics Concern  . None   Social History Narrative   History reviewed. No pertinent family history. No Known Allergies Prior to Admission medications   Medication Sig Start Date End Date Taking? Authorizing Provider  baclofen (LIORESAL) 10 MG tablet Take 1 tablet (10 mg total) by mouth 3  (three) times daily. As needed for muscle spasm Patient not taking: Reported on 07/19/2015 05/18/15   Teryl LucyJoshua Dianna Ewald, MD  ondansetron (ZOFRAN) 4 MG tablet Take 1 tablet (4 mg total) by mouth every 8 (eight) hours as needed for nausea or vomiting. Patient not taking: Reported on 07/19/2015 05/18/15   Teryl LucyJoshua Halley Shepheard, MD  oxyCODONE-acetaminophen (PERCOCET) 10-325 MG tablet Take 1-2 tablets by mouth every 6 (six) hours as needed for pain. MAXIMUM TOTAL ACETAMINOPHEN DOSE IS 4000 MG PER DAY Patient not taking: Reported on 07/19/2015 05/18/15   Teryl LucyJoshua Twinkle Sockwell, MD  rivaroxaban (XARELTO) 10 MG TABS tablet Take 1 tablet (10 mg total) by mouth daily. Patient not taking: Reported on 07/19/2015 05/18/15   Teryl LucyJoshua Isiac Breighner, MD  sennosides-docusate sodium (SENOKOT-S) 8.6-50 MG tablet Take 2 tablets by mouth daily. Patient not taking: Reported on 07/19/2015 05/18/15   Teryl LucyJoshua Joshu Furukawa, MD     Positive ROS: All other systems have been reviewed and were otherwise negative with the exception of those mentioned in the HPI and as above.  Physical Exam: General: Alert, no acute distress Cardiovascular: No pedal edema Respiratory: No cyanosis, no use of accessory musculature GI: No organomegaly, abdomen is soft and non-tender Skin: No lesions in the area of chief complaint Neurologic: Sensation intact distally Psychiatric: Patient is competent for consent with normal mood and affect Lymphatic: No axillary or cervical lymphadenopathy  MUSCULOSKELETAL: right hip AROM 0-90 with pain and crepitance with IR/ER.  Assessment: DJD RIGHT HIP   Plan: Plan for Procedure(s): TOTAL HIP ARTHROPLASTY  The risks benefits and alternatives were discussed with the patient including but not limited to the risks of nonoperative treatment, versus surgical intervention including infection, bleeding, nerve injury, periprosthetic fracture, the need for revision surgery, dislocation, leg length discrepancy, blood clots, cardiopulmonary complications,  morbidity, mortality, among others, and they were willing to proceed.     Eulas Post, MD Cell [phone]   08/03/2015 7:24 AM

## 2015-08-03 NOTE — Progress Notes (Signed)
Utilization review completed.  

## 2015-08-03 NOTE — Discharge Instructions (Signed)
INSTRUCTIONS AFTER JOINT REPLACEMENT  ° °o Remove items at home which could result in a fall. This includes throw rugs or furniture in walking pathways °o ICE to the affected joint every three hours while awake for 30 minutes at a time, for at least the first 3-5 days, and then as needed for pain and swelling.  Continue to use ice for pain and swelling. You may notice swelling that will progress down to the foot and ankle.  This is normal after surgery.  Elevate your leg when you are not up walking on it.   °o Continue to use the breathing machine you got in the hospital (incentive spirometer) which will help keep your temperature down.  It is common for your temperature to cycle up and down following surgery, especially at night when you are not up moving around and exerting yourself.  The breathing machine keeps your lungs expanded and your temperature down. ° ° °DIET:  As you were doing prior to hospitalization, we recommend a well-balanced diet. ° °DRESSING / WOUND CARE / SHOWERING ° °You may change your dressing 3-5 days after surgery.  Then change the dressing every day with sterile gauze.  Please use good hand washing techniques before changing the dressing.  Do not use any lotions or creams on the incision until instructed by your surgeon. ° °ACTIVITY ° °o Increase activity slowly as tolerated, but follow the weight bearing instructions below.   °o No driving for 6 weeks or until further direction given by your physician.  You cannot drive while taking narcotics.  °o No lifting or carrying greater than 10 lbs. until further directed by your surgeon. °o Avoid periods of inactivity such as sitting longer than an hour when not asleep. This helps prevent blood clots.  °o You may return to work once you are authorized by your doctor.  ° ° ° °WEIGHT BEARING  ° °Weight bearing as tolerated with assist device (walker, cane, etc) as directed, use it as long as suggested by your surgeon or therapist, typically at  least 4-6 weeks. ° ° °EXERCISES ° °Results after joint replacement surgery are often greatly improved when you follow the exercise, range of motion and muscle strengthening exercises prescribed by your doctor. Safety measures are also important to protect the joint from further injury. Any time any of these exercises cause you to have increased pain or swelling, decrease what you are doing until you are comfortable again and then slowly increase them. If you have problems or questions, call your caregiver or physical therapist for advice.  ° °Rehabilitation is important following a joint replacement. After just a few days of immobilization, the muscles of the leg can become weakened and shrink (atrophy).  These exercises are designed to build up the tone and strength of the thigh and leg muscles and to improve motion. Often times heat used for twenty to thirty minutes before working out will loosen up your tissues and help with improving the range of motion but do not use heat for the first two weeks following surgery (sometimes heat can increase post-operative swelling).  ° °These exercises can be done on a training (exercise) mat, on the floor, on a table or on a bed. Use whatever works the best and is most comfortable for you.    Use music or television while you are exercising so that the exercises are a pleasant break in your day. This will make your life better with the exercises acting as a break   in your routine that you can look forward to.   Perform all exercises about fifteen times, three times per day or as directed.  You should exercise both the operative leg and the other leg as well. ° °Exercises include: °  °• Quad Sets - Tighten up the muscle on the front of the thigh (Quad) and hold for 5-10 seconds.   °• Straight Leg Raises - With your knee straight (if you were given a brace, keep it on), lift the leg to 60 degrees, hold for 3 seconds, and slowly lower the leg.  Perform this exercise against  resistance later as your leg gets stronger.  °• Leg Slides: Lying on your back, slowly slide your foot toward your buttocks, bending your knee up off the floor (only go as far as is comfortable). Then slowly slide your foot back down until your leg is flat on the floor again.  °• Angel Wings: Lying on your back spread your legs to the side as far apart as you can without causing discomfort.  °• Hamstring Strength:  Lying on your back, push your heel against the floor with your leg straight by tightening up the muscles of your buttocks.  Repeat, but this time bend your knee to a comfortable angle, and push your heel against the floor.  You may put a pillow under the heel to make it more comfortable if necessary.  ° °A rehabilitation program following joint replacement surgery can speed recovery and prevent re-injury in the future due to weakened muscles. Contact your doctor or a physical therapist for more information on knee rehabilitation.  ° ° °CONSTIPATION ° °Constipation is defined medically as fewer than three stools per week and severe constipation as less than one stool per week.  Even if you have a regular bowel pattern at home, your normal regimen is likely to be disrupted due to multiple reasons following surgery.  Combination of anesthesia, postoperative narcotics, change in appetite and fluid intake all can affect your bowels.  ° °YOU MUST use at least one of the following options; they are listed in order of increasing strength to get the job done.  They are all available over the counter, and you may need to use some, POSSIBLY even all of these options:   ° °Drink plenty of fluids (prune juice may be helpful) and high fiber foods °Colace 100 mg by mouth twice a day  °Senokot for constipation as directed and as needed Dulcolax (bisacodyl), take with full glass of water  °Miralax (polyethylene glycol) once or twice a day as needed. ° °If you have tried all these things and are unable to have a bowel  movement in the first 3-4 days after surgery call either your surgeon or your primary doctor.   ° °If you experience loose stools or diarrhea, hold the medications until you stool forms back up.  If your symptoms do not get better within 1 week or if they get worse, check with your doctor.  If you experience "the worst abdominal pain ever" or develop nausea or vomiting, please contact the office immediately for further recommendations for treatment. ° ° °ITCHING:  If you experience itching with your medications, try taking only a single pain pill, or even half a pain pill at a time.  You can also use Benadryl over the counter for itching or also to help with sleep.  ° °TED HOSE STOCKINGS:  Use stockings on both legs until for at least 2 weeks or as   directed by physician office. They may be removed at night for sleeping. ° °MEDICATIONS:  See your medication summary on the “After Visit Summary” that nursing will review with you.  You may have some home medications which will be placed on hold until you complete the course of blood thinner medication.  It is important for you to complete the blood thinner medication as prescribed. ° °PRECAUTIONS:  If you experience chest pain or shortness of breath - call 911 immediately for transfer to the hospital emergency department.  ° °If you develop a fever greater that 101 F, purulent drainage from wound, increased redness or drainage from wound, foul odor from the wound/dressing, or calf pain - CONTACT YOUR SURGEON.   °                                                °FOLLOW-UP APPOINTMENTS:  If you do not already have a post-op appointment, please call the office for an appointment to be seen by your surgeon.  Guidelines for how soon to be seen are listed in your “After Visit Summary”, but are typically between 1-4 weeks after surgery. ° °OTHER INSTRUCTIONS:  ° °Knee Replacement:  Do not place pillow under knee, focus on keeping the knee straight while resting. CPM  instructions: 0-90 degrees, 2 hours in the morning, 2 hours in the afternoon, and 2 hours in the evening. Place foam block, curve side up under heel at all times except when in CPM or when walking.  DO NOT modify, tear, cut, or change the foam block in any way. ° °MAKE SURE YOU:  °• Understand these instructions.  °• Get help right away if you are not doing well or get worse.  ° ° °Thank you for letting us be a part of your medical care team.  It is a privilege we respect greatly.  We hope these instructions will help you stay on track for a fast and full recovery!  ° °Information on my medicine - XARELTO® (Rivaroxaban) ° °This medication education was reviewed with me or my healthcare representative as part of my discharge preparation. ° °Why was Xarelto® prescribed for you? °Xarelto® was prescribed for you to reduce the risk of blood clots forming after orthopedic surgery. The medical term for these abnormal blood clots is venous thromboembolism (VTE). ° °What do you need to know about xarelto® ? °Take your Xarelto® ONCE DAILY at the same time every day. °You may take it either with or without food. ° °If you have difficulty swallowing the tablet whole, you may crush it and mix in applesauce just prior to taking your dose. ° °Take Xarelto® exactly as prescribed by your doctor and DO NOT stop taking Xarelto® without talking to the doctor who prescribed the medication.  Stopping without other VTE prevention medication to take the place of Xarelto® may increase your risk of developing a clot. ° °After discharge, you should have regular check-up appointments with your healthcare provider that is prescribing your Xarelto®.   ° °What do you do if you miss a dose? °If you miss a dose, take it as soon as you remember on the same day then continue your regularly scheduled once daily regimen the next day. Do not take two doses of Xarelto® on the same day.  ° °Important Safety Information °A possible side effect of Xarelto®    is bleeding. You should call your healthcare provider right away if you experience any of the following: °? Bleeding from an injury or your nose that does not stop. °? Unusual colored urine (red or dark brown) or unusual colored stools (red or black). °? Unusual bruising for unknown reasons. °? A serious fall or if you hit your head (even if there is no bleeding). ° °Some medicines may interact with Xarelto® and might increase your risk of bleeding while on Xarelto®. To help avoid this, consult your healthcare provider or pharmacist prior to using any new prescription or non-prescription medications, including herbals, vitamins, non-steroidal anti-inflammatory drugs (NSAIDs) and supplements. ° °This website has more information on Xarelto®: www.xarelto.com. ° ° ° ° °

## 2015-08-03 NOTE — Evaluation (Signed)
Physical Therapy Evaluation Patient Details Name: Paul Francis MRN: [id number] DOB: Nov 30, 1965 Today's Date: 08/03/2015   History of Present Illness  Pt is a 50 y/o M s/p Rt THA.  Pt's PMH includes Lt THA 05/2015, anxiety, COPD, back pain.  Clinical Impression  Pt is s/p Rt THA resulting in the deficits listed below (see PT Problem List). Paul Francis presents w/ impaired strength, ROM, and balance. He was Ind PTA and is eager to regain independence.  He ambulated 80 ft using RW and w/ min guard assist for safety. He requires mod cues to follow hip precautions w/ bed mobility.  Pt will benefit from skilled PT to increase their independence and safety with mobility to allow discharge to the venue listed below.     Follow Up Recommendations Home health PT;Supervision for mobility/OOB    Equipment Recommendations  None recommended by PT    Recommendations for Other Services OT consult     Precautions / Restrictions Precautions Precautions: Fall;Posterior Hip Precaution Booklet Issued: Yes (comment) Precaution Comments: Reviewed hip precautions.  Pt able to recall 3/3 from recent Lt THA. Restrictions Weight Bearing Restrictions: Yes RLE Weight Bearing: Weight bearing as tolerated      Mobility  Bed Mobility Overal bed mobility: Needs Assistance Bed Mobility: Supine to Sit     Supine to sit: Min guard;HOB elevated     General bed mobility comments: Increased time and effort.  HOB elevated and use of bed rail.  Cues to follow hip precautions.  Transfers Overall transfer level: Needs assistance Equipment used: Rolling walker (2 wheeled) Transfers: Sit to/from Stand Sit to Stand: Min guard         General transfer comment: Pt slow to stand.  Cues to scoot Rt LE in front when sitting down.  Ambulation/Gait Ambulation/Gait assistance: Min guard Ambulation Distance (Feet): 80 Feet Assistive device: Rolling walker (2 wheeled) Gait Pattern/deviations: Step-to  pattern;Step-through pattern;Decreased stride length   Gait velocity interpretation: Below normal speed for age/gender General Gait Details: Initially heavily WB through Bil UEs w/ step to gait pattern.  Emerging step through gait pattern w/ gradually increasing WB on Rt LE.    Stairs            Wheelchair Mobility    Modified Rankin (Stroke Patients Only)       Balance Overall balance assessment: Needs assistance Sitting-balance support: No upper extremity supported;Feet supported Sitting balance-Leahy Scale: Good     Standing balance support: During functional activity;Single extremity supported Standing balance-Leahy Scale: Fair Standing balance comment: Pt able to stand at commode w/ 1 UE supported                             Pertinent Vitals/Pain Pain Assessment: 0-10 Pain Score: 8  Pain Location: Rt hip and thigh Pain Descriptors / Indicators: Aching;Sore;Grimacing;Guarding Pain Intervention(s): Limited activity within patient's tolerance;Monitored during session;Repositioned    Home Living Family/patient expects to be discharged to:: Private residence Living Arrangements: Spouse/significant other Available Help at Discharge: Family;Available 24 hours/day Type of Home: House Home Access: Stairs to enter Entrance Stairs-Rails: None Entrance Stairs-Number of Steps: 1 Home Layout: One level Home Equipment: Bedside commode;Walker - 2 wheels      Prior Function Level of Independence: Independent         Comments: Pt was not using AD and did not need any assist w/ ADLs     Hand Dominance  Extremity/Trunk Assessment   Upper Extremity Assessment: Defer to OT evaluation           Lower Extremity Assessment: RLE deficits/detail;LLE deficits/detail RLE Deficits / Details: weakness and limited ROM as expected s/p Rt THA LLE Deficits / Details: s/p Lt THA 05/2015.  Strength not formally tested but functionally [facility]     Communication    Communication: No difficulties  Cognition Arousal/Alertness: Awake/alert Behavior During Therapy: WFL for tasks assessed/performed Overall Cognitive Status: Within Functional Limits for tasks assessed                      General Comments      Exercises Total Joint Exercises Ankle Circles/Pumps: AROM;Both;10 reps;Seated Long Arc Quad: AROM;Both;10 reps;Seated      Assessment/Plan    PT Assessment Patient needs continued PT services  PT Diagnosis Difficulty walking;Abnormality of gait;Acute pain   PT Problem List Decreased strength;Decreased range of motion;Decreased activity tolerance;Decreased balance;Decreased mobility;Decreased knowledge of use of DME;Decreased safety awareness;Decreased knowledge of precautions;Pain  PT Treatment Interventions DME instruction;Gait training;Stair training;Functional mobility training;Therapeutic activities;Therapeutic exercise;Balance training;Neuromuscular re-education;Patient/family education;Modalities   PT Goals (Current goals can be found in the Care Plan section) Acute Rehab PT Goals Patient Stated Goal: to go home PT Goal Formulation: With patient Time For Goal Achievement: 08/10/15 Potential to Achieve Goals: Good    Frequency 7X/week   Barriers to discharge Inaccessible home environment 1 short step to enter home    Co-evaluation               End of Session Equipment Utilized During Treatment: Gait belt Activity Tolerance: Patient tolerated treatment well;Patient limited by pain Patient left: in chair;with call bell/phone within reach;with chair alarm set Nurse Communication: Mobility status;Weight bearing status;Precautions         Time: 7829-5621 PT Time Calculation (min) (ACUTE ONLY): 33 min   Charges:   PT Evaluation $PT Eval Low Complexity: 1 Procedure PT Treatments $Gait Training: 8-22 mins   PT G Codes:       Encarnacion Chu PT, DPT  Pager: [phone] Phone: [phone] 08/03/2015, 4:31  PM

## 2015-08-04 ENCOUNTER — Encounter (HOSPITAL_COMMUNITY): Payer: Self-pay | Admitting: Orthopedic Surgery

## 2015-08-04 LAB — BASIC METABOLIC PANEL
ANION GAP: 9 (ref 5–15)
BUN: 8 mg/dL (ref 6–20)
CALCIUM: 8.7 mg/dL — AB (ref 8.9–10.3)
CO2: 24 mmol/L (ref 22–32)
Chloride: 102 mmol/L (ref 101–111)
Creatinine, Ser: 1.08 mg/dL (ref 0.61–1.24)
Glucose, Bld: 116 mg/dL — ABNORMAL HIGH (ref 65–99)
POTASSIUM: 4.1 mmol/L (ref 3.5–5.1)
Sodium: 135 mmol/L (ref 135–145)

## 2015-08-04 LAB — CBC
HEMATOCRIT: 39 % (ref 39.0–52.0)
Hemoglobin: 13.1 g/dL (ref 13.0–17.0)
MCH: 31.5 pg (ref 26.0–34.0)
MCHC: 33.6 g/dL (ref 30.0–36.0)
MCV: 93.8 fL (ref 78.0–100.0)
PLATELETS: 237 10*3/uL (ref 150–400)
RBC: 4.16 MIL/uL — ABNORMAL LOW (ref 4.22–5.81)
RDW: 14.6 % (ref 11.5–15.5)
WBC: 10.1 10*3/uL (ref 4.0–10.5)

## 2015-08-04 NOTE — Progress Notes (Signed)
Patient ID: Paul BroachMarvin L Francis, male   DOB: 1965-08-08, 50 y.o.   MRN: [id number]     Subjective:  Patient reports pain as mild to moderate.  Patient reporting more pain with this surgery than the last but overall doing well.  Denies any CP or SOB  Objective:   VITALS:   Filed Vitals:   08/03/15 1130 08/03/15 1215 08/03/15 1240 08/03/15 2057  BP: 133/88 105/93 142/81 129/75  Pulse: 65 68 70 87  Temp:  97.4 F (36.3 C) 98.7 F (37.1 C) 98.2 F (36.8 C)  TempSrc:   Oral Oral  Resp: 16 13 14 16   Weight:      SpO2: 99% 100% 100% 96%    ABD soft Sensation intact distally Dorsiflexion/Plantar flexion intact Incision: dressing C/D/I and no drainage   Lab Results  Component Value Date   WBC 10.1 08/04/2015   HGB 13.1 08/04/2015   HCT 39.0 08/04/2015   MCV 93.8 08/04/2015   PLT 237 08/04/2015   BMET    Component Value Date/Time   NA 135 08/04/2015 0308   K 4.1 08/04/2015 0308   CL 102 08/04/2015 0308   CO2 24 08/04/2015 0308   GLUCOSE 116* 08/04/2015 0308   BUN 8 08/04/2015 0308   CREATININE 1.08 08/04/2015 0308   CALCIUM 8.7* 08/04/2015 0308   GFRNONAA >60 08/04/2015 0308   GFRAA >60 08/04/2015 0308     Assessment/Plan: 1 Day Post-Op   Principal Problem:   Avascular necrosis of bone of right hip (HCC) Active Problems:   S/P total hip arthroplasty   Advance diet Up with therapy WBAT Dry dressing PRN Patient able for DC later today but need to work with PT in the AM and maybe even the PM before DC.  Pain may not be controlled well enough to allow for DC   DOUGLAS PARRY, BRANDON 08/04/2015, 8:20 AM  Seen and agree with above.  Dc home when pain controlled, this pm vs. Tomorrow.   Teryl LucyJoshua Micha Dosanjh, MD Cell [phone]

## 2015-08-04 NOTE — Care Management Note (Signed)
Case Management Note  Patient Details  Name: Paul Francis MRN: [id number] Date of Birth: 06/05/1965  Subjective/Objective:               S/p right total knee arthroplasty     Action/Plan: [facility] but [facility] which [facility] with in the past. Contacted Stephanie at [facility] nad set up HHPT. [facility] at [facility] informed of the change. Patient has a rolling walker and 3n1 at home and his wife will be assisting him after discharge. No other discharge needs identified.   Expected Discharge Date:                  Expected Discharge Plan:  Home w Home Health Services  In-House Referral:  NA  Discharge planning Services  CM Consult  Post Acute Care Choice:  Home Health Choice offered to:  Patient  DME Arranged:  N/A DME Agency:     HH Arranged:  PT HH Agency:  [facility]  Status of Service:  Completed, signed off  Medicare Important Message Given:    Date Medicare IM Given:    Medicare IM give by:    Date Additional Medicare IM Given:    Additional Medicare Important Message give by:     If discussed at Long Length of Stay Meetings, dates discussed:    Additional Comments:  Monica BectonKrieg, Huong Luthi Watson, RN 08/04/2015, 10:59 AM

## 2015-08-04 NOTE — Progress Notes (Signed)
Physical Therapy Treatment Patient Details Name: Paul Francis MRN: [id number] DOB: 09/02/1965 Today's Date: 08/04/2015    History of Present Illness Pt is a 50 y/o M s/p Rt THA.  Pt's PMH includes Lt THA 05/2015, anxiety, COPD, back pain.    PT Comments    Pt progressing well with mobility and eager for return home.  Reviewed car transfers and step into house with pt and spouse.  Follow Up Recommendations  Home health PT;Supervision for mobility/OOB     Equipment Recommendations  None recommended by PT    Recommendations for Other Services OT consult     Precautions / Restrictions Precautions Precautions: Fall;Posterior Hip Precaution Comments: Reviewed hip precautions.  Pt able to recall 3/3 from recent Lt THA. Restrictions Weight Bearing Restrictions: No RLE Weight Bearing: Weight bearing as tolerated    Mobility  Bed Mobility Overal bed mobility: Needs Assistance Bed Mobility: Supine to Sit     Supine to sit: Min guard     General bed mobility comments: Increased time and effort. Pt self assisting R LE with belt and cues for adherence to THP  Transfers Overall transfer level: Needs assistance Equipment used: Rolling walker (2 wheeled) Transfers: Sit to/from Stand Sit to Stand: Supervision         General transfer comment: Pt slow to stand.  Pt self cued to scoot Rt LE in front when sitting down.  Ambulation/Gait Ambulation/Gait assistance: Min guard;Supervision Ambulation Distance (Feet): 150 Feet Assistive device: Rolling walker (2 wheeled) Gait Pattern/deviations: Step-to pattern;Step-through pattern;Decreased step length - right;Decreased step length - left;Shuffle;Trunk flexed Gait velocity: decr Gait velocity interpretation: Below normal speed for age/gender General Gait Details: cues for posture, position from RW, R LE ER.  Gait initiated with step to and progressed to step through   Stairs Stairs: Yes Stairs assistance: Min assist Stair  Management: No rails;Step to pattern;Backwards;With walker Number of Stairs: 1 General stair comments: single step twice with RW bkwd - cues for sequence and foot placement.  Spouse present   Wheelchair Mobility    Modified Rankin (Stroke Patients Only)       Balance                                    Cognition Arousal/Alertness: Awake/alert Behavior During Therapy: WFL for tasks assessed/performed Overall Cognitive Status: Within Functional Limits for tasks assessed                      Exercises      General Comments General comments (skin integrity, edema, etc.): Pt into bathroom to brush teeth with Sup only for saftey      Pertinent Vitals/Pain Pain Assessment: 0-10 Pain Score: 4  Pain Location: R hip/thigh Pain Descriptors / Indicators: Aching;Sore    Home Living                      Prior Function            PT Goals (current goals can now be found in the care plan section) Acute Rehab PT Goals Patient Stated Goal: HOme today PT Goal Formulation: With patient Time For Goal Achievement: 08/10/15 Potential to Achieve Goals: Good Progress towards PT goals: Progressing toward goals    Frequency  7X/week    PT Plan Current plan remains appropriate    Co-evaluation  End of Session Equipment Utilized During Treatment: Gait belt Activity Tolerance: Patient tolerated treatment well Patient left: Other (comment) (sitting EOB)     Time: [id number] PT Time Calculation (min) (ACUTE ONLY): 30 min  Charges:  $Gait Training: 8-22 mins $Therapeutic Activity: 8-22 mins                    G Codes:      Ralene Gasparyan 08/04/2015, 2:14 PM

## 2015-08-04 NOTE — Progress Notes (Signed)
Pt discharged from unit alert and oriented. IV removed with angiocath intact and dry gauze dsg in place to site. NO c/o pain, no s/sx of distress. All personal belongings with pt to pvt auto to home with wife. All discharge instructions and discharge rx given and reviewed with patient and wife.

## 2015-08-04 NOTE — Progress Notes (Signed)
Physical Therapy Treatment Patient Details Name: Paul Francis MRN: [id number] DOB: 1966-03-05 Today's Date: 08/04/2015    History of Present Illness Pt is a 50 y/o M s/p Rt THA.  Pt's PMH includes Lt THA 05/2015, anxiety, COPD, back pain.    PT Comments    Pt progressing with mobility but with increased time all activities 2* pain.    Follow Up Recommendations  Home health PT;Supervision for mobility/OOB     Equipment Recommendations  None recommended by PT    Recommendations for Other Services OT consult     Precautions / Restrictions Precautions Precautions: Fall;Posterior Hip Precaution Booklet Issued: Yes (comment) Precaution Comments: Reviewed hip precautions.  Pt able to recall 3/3 from recent Lt THA. Restrictions Weight Bearing Restrictions: No RLE Weight Bearing: Weight bearing as tolerated    Mobility  Bed Mobility Overal bed mobility: Needs Assistance Bed Mobility: Supine to Sit     Supine to sit: Min assist     General bed mobility comments: Increased time and effort.  Min assist to manage L LE - pt reporting increased pain from last evening  Transfers Overall transfer level: Needs assistance Equipment used: Rolling walker (2 wheeled) Transfers: Sit to/from Stand Sit to Stand: Min guard         General transfer comment: Pt slow to stand.  Cues to scoot Rt LE in front when sitting down.  Ambulation/Gait Ambulation/Gait assistance: Min guard Ambulation Distance (Feet): 90 Feet Assistive device: Rolling walker (2 wheeled) Gait Pattern/deviations: Decreased step length - left;Decreased step length - right;Shuffle;Trunk flexed;Step-to pattern;Step-through pattern Gait velocity: decr Gait velocity interpretation: Below normal speed for age/gender General Gait Details: cues for posture, position from RW, R LE ER.  Gait initiated with step to and progressed to step through   [employer]    Modified Rankin (Stroke  Patients Only)       Balance     Sitting balance-Leahy Scale: Good       Standing balance-Leahy Scale: Fair                      Cognition Arousal/Alertness: Awake/alert Behavior During Therapy: WFL for tasks assessed/performed Overall Cognitive Status: Within Functional Limits for tasks assessed                      Exercises Total Joint Exercises Ankle Circles/Pumps: AROM;Both;10 reps;Seated Quad Sets: AROM;Both;10 reps Heel Slides: AAROM;Right;15 reps;Supine Hip ABduction/ADduction: AAROM;Right;10 reps;Supine    General Comments        Pertinent Vitals/Pain Pain Assessment: 0-10 Pain Score: 7  Pain Location: R hip and thigh Pain Descriptors / Indicators: Aching;Burning;Sore    Home Living                      Prior Function            PT Goals (current goals can now be found in the care plan section) Acute Rehab PT Goals Patient Stated Goal: 1040 PT Goal Formulation: With patient Time For Goal Achievement: 08/10/15 Potential to Achieve Goals: Good Progress towards PT goals: Progressing toward goals    Frequency  7X/week    PT Plan Current plan remains appropriate    Co-evaluation             End of Session Equipment Utilized During Treatment: Gait belt Activity Tolerance: Patient tolerated treatment well;Patient limited by pain Patient left:  in chair;with call bell/phone within reach;with chair alarm set     Time: 1008-1040 PT Time Calculation (min) (ACUTE ONLY): 32 min  Charges:  $Gait Training: 8-22 mins $Therapeutic Exercise: 8-22 mins                    G Codes:      Eleena Grater 08/04/2015, 12:59 PM

## 2015-08-04 NOTE — Progress Notes (Signed)
OT Cancellation Note  Patient Details Name: Paul BroachMarvin L Francis MRN: [id number] DOB: [birth year]   Cancelled Treatment:    Reason Eval/Treat Not Completed:  (OT screened) Spoke with pt and no OT concerns voiced. OT signing off.  Earlie RavelingStraub, Ivonna Kinnick L OTR/L [phone] 08/04/2015, 12:35 PM

## 2015-08-04 NOTE — Discharge Summary (Signed)
Physician Discharge Summary  Patient ID: Paul Francis MRN: [id number] DOB/AGE: 1966-03-19 50 y.o.  Admit date: 08/03/2015 Discharge date: 08/04/2015  Admission Diagnoses:  Avascular necrosis of bone of right hip [facility])  Discharge Diagnoses:  Principal Problem:   Avascular necrosis of bone of right hip (HCC) Active Problems:   S/P total hip arthroplasty   Past Medical History  Diagnosis Date  . Anxiety   . Complication of anesthesia     states eyes were burning and had to be washed out with saline while in recovery  . COPD (chronic obstructive pulmonary disease) (HCC)     "inital" but no treatment  . History of bronchitis > 2 yrs ago  . Arthritis   . Joint pain   . Joint swelling   . Back pain     DDD  . Avascular necrosis of bone of right hip (HCC) 08/03/2015    Surgeries: Procedure(s): TOTAL HIP ARTHROPLASTY on 08/03/2015   Consultants (if any):    Discharged Condition: Improved  Hospital Course: CALIB WADHWA is an 50 y.o. male who was admitted 08/03/2015 with a diagnosis of Avascular necrosis of bone of right hip (HCC) and went to the operating room on 08/03/2015 and underwent the above named procedures.    He was given perioperative antibiotics:  Anti-infectives    Start     Dose/Rate Route Frequency Ordered Stop   08/03/15 1330  ceFAZolin (ANCEF) IVPB 2g/100 mL premix     2 g 200 mL/hr over 30 Minutes Intravenous Every 6 hours 08/03/15 1233 08/03/15 2232   08/03/15 0600  ceFAZolin (ANCEF) IVPB 2g/100 mL premix     2 g 200 mL/hr over 30 Minutes Intravenous To ShortStay Surgical 08/02/15 1237 08/03/15 0730    .  He was given sequential compression devices, early ambulation, and xarelto for DVT prophylaxis.  He benefited maximally from the hospital stay and there were no complications.    Recent vital signs:  Filed Vitals:   08/03/15 1240 08/03/15 2057  BP: 142/81 129/75  Pulse: 70 87  Temp: 98.7 F (37.1 C) 98.2 F (36.8 C)  Resp: 14 16    Recent  laboratory studies:  Lab Results  Component Value Date   HGB 13.1 08/04/2015   HGB 15.1 07/21/2015   HGB 12.5* 05/19/2015   Lab Results  Component Value Date   WBC 10.1 08/04/2015   PLT 237 08/04/2015   No results found for: INR Lab Results  Component Value Date   NA 135 08/04/2015   K 4.1 08/04/2015   CL 102 08/04/2015   CO2 24 08/04/2015   BUN 8 08/04/2015   CREATININE 1.08 08/04/2015   GLUCOSE 116* 08/04/2015    Discharge Medications:     Medication List    TAKE these medications        baclofen 10 MG tablet  Commonly known as:  LIORESAL  Take 1 tablet (10 mg total) by mouth 3 (three) times daily. As needed for muscle spasm     ondansetron 4 MG tablet  Commonly known as:  ZOFRAN  Take 1 tablet (4 mg total) by mouth every 8 (eight) hours as needed for nausea or vomiting.     oxyCODONE-acetaminophen 10-325 MG tablet  Commonly known as:  PERCOCET  Take 1-2 tablets by mouth every 6 (six) hours as needed for pain. MAXIMUM TOTAL ACETAMINOPHEN DOSE IS 4000 MG PER DAY     rivaroxaban 10 MG Tabs tablet  Commonly known as:  XARELTO  Take  1 tablet (10 mg total) by mouth daily.     sennosides-docusate sodium 8.6-50 MG tablet  Commonly known as:  SENOKOT-S  Take 2 tablets by mouth daily.        Diagnostic Studies: Dg Hip Port Unilat With Pelvis 1v Right  08/03/2015  CLINICAL DATA:  Right hip replacement. EXAM: DG HIP (WITH OR WITHOUT PELVIS) 1V PORT RIGHT COMPARISON:  05/18/2015. FINDINGS: Right hip replacement with good anatomic alignment. Hardware intact. Prior left hip replacement. Performed os calcification. Diffuse osteopenia . IMPRESSION: Right hip replacement with good anatomic alignment. Hardware intact. Electronically Signed   By: Maisie Fushomas  Register   On: 08/03/2015 11:23    Disposition: 01-Home or Self Care        Follow-up Information    Follow up with Eulas PostLANDAU,Sayaka Hoeppner P, MD. Schedule an appointment as soon as possible for a visit in 2 weeks.   Specialty:   Orthopedic Surgery   Contact information:   [address] [place] [address] [phone]       Follow up with [facility].   Why:  They will contact you to schedule home therapy visits.   Contact information:   [address] [place] [address] [phone]        Signed: Eulas PostLANDAU,Ellerie Arenz P 08/04/2015, 11:45 AM

## 2016-05-17 DIAGNOSIS — Z96642 Presence of left artificial hip joint: Secondary | ICD-10-CM | POA: Diagnosis not present

## 2016-06-08 DIAGNOSIS — Z6821 Body mass index (BMI) 21.0-21.9, adult: Secondary | ICD-10-CM | POA: Diagnosis not present

## 2016-06-08 DIAGNOSIS — Z0001 Encounter for general adult medical examination with abnormal findings: Secondary | ICD-10-CM | POA: Diagnosis not present

## 2016-06-08 DIAGNOSIS — Z1389 Encounter for screening for other disorder: Secondary | ICD-10-CM | POA: Diagnosis not present

## 2016-06-08 DIAGNOSIS — Z96649 Presence of unspecified artificial hip joint: Secondary | ICD-10-CM | POA: Diagnosis not present

## 2017-05-30 DIAGNOSIS — Z96642 Presence of left artificial hip joint: Secondary | ICD-10-CM | POA: Diagnosis not present

## 2017-08-03 DIAGNOSIS — J449 Chronic obstructive pulmonary disease, unspecified: Secondary | ICD-10-CM | POA: Diagnosis not present

## 2017-08-03 DIAGNOSIS — E7849 Other hyperlipidemia: Secondary | ICD-10-CM | POA: Diagnosis not present

## 2017-08-03 DIAGNOSIS — Z1389 Encounter for screening for other disorder: Secondary | ICD-10-CM | POA: Diagnosis not present

## 2017-08-03 DIAGNOSIS — M720 Palmar fascial fibromatosis [Dupuytren]: Secondary | ICD-10-CM | POA: Diagnosis not present

## 2017-08-03 DIAGNOSIS — Z0001 Encounter for general adult medical examination with abnormal findings: Secondary | ICD-10-CM | POA: Diagnosis not present

## 2017-10-20 IMAGING — DX DG HIP (WITH OR WITHOUT PELVIS) 1V PORT*R*
3 series · 3 of 3 positions shown · non-contrast
Comparison: 05/18/2015.

CLINICAL DATA: Right hip replacement.

EXAM:
DG HIP (WITH OR WITHOUT PELVIS) 1V PORT RIGHT

[pelvis ap]
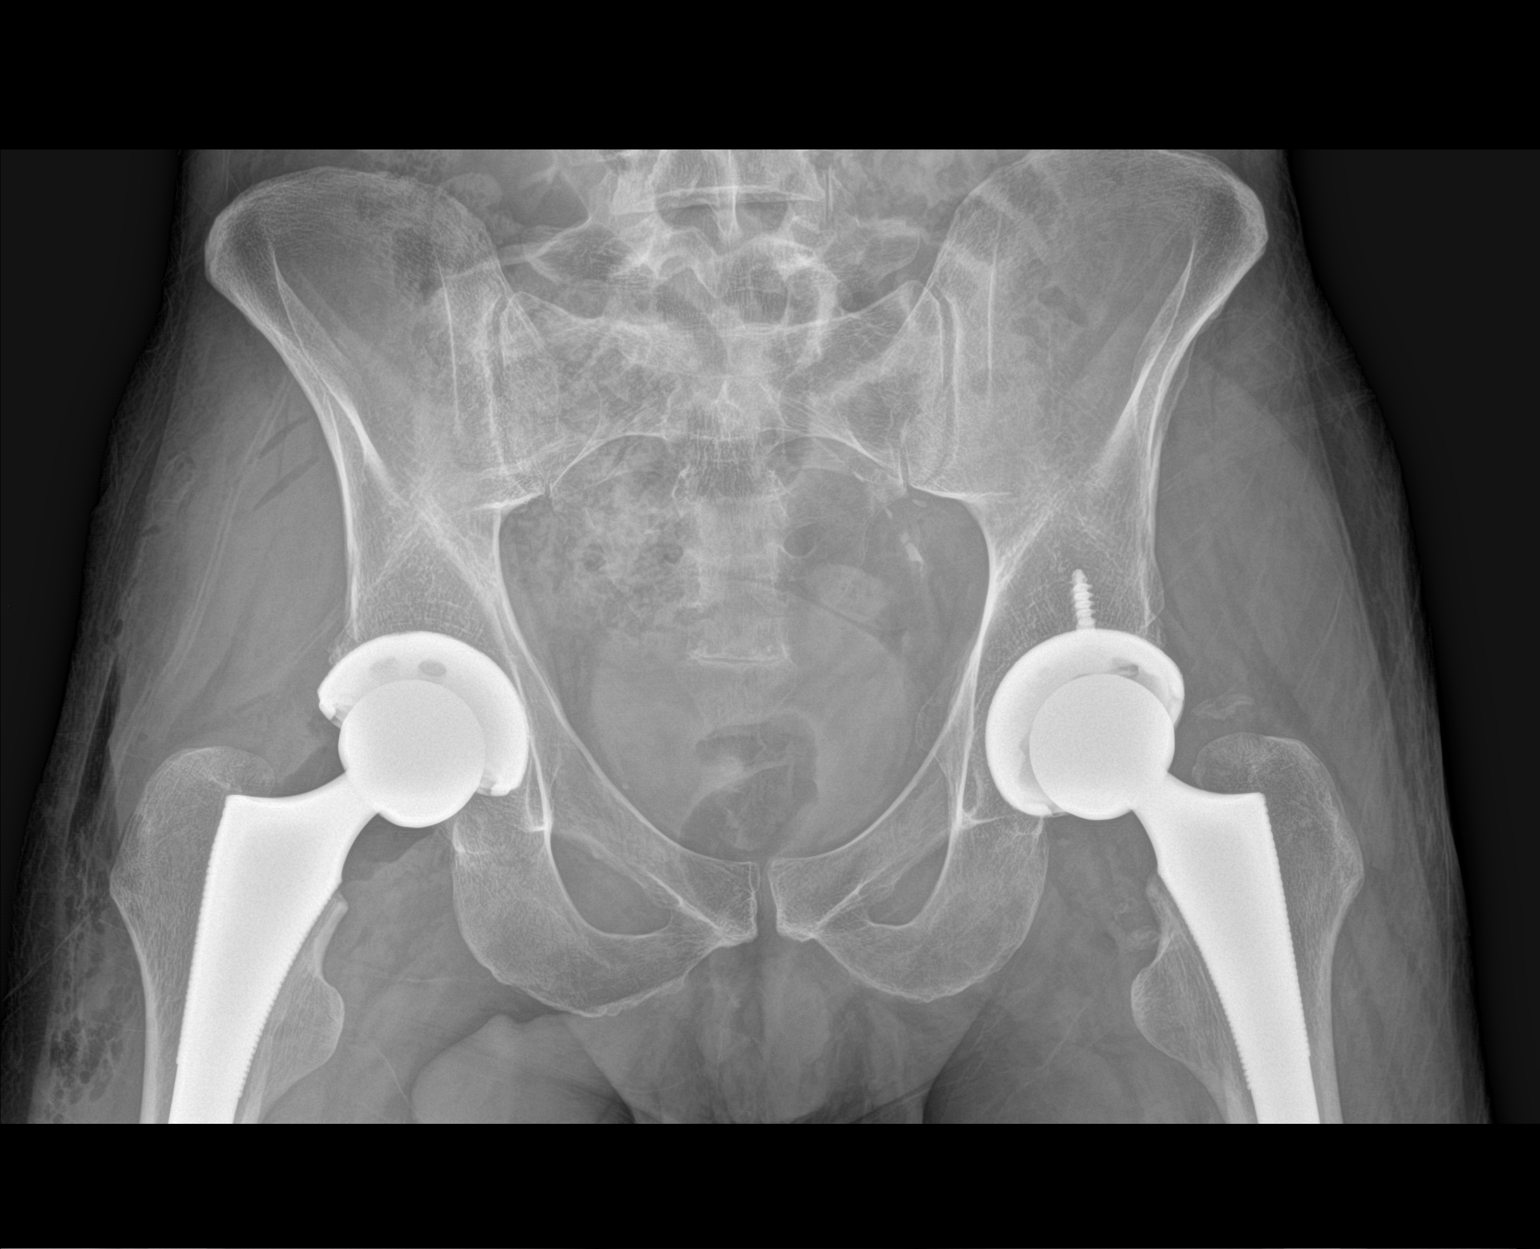

[hip ap]
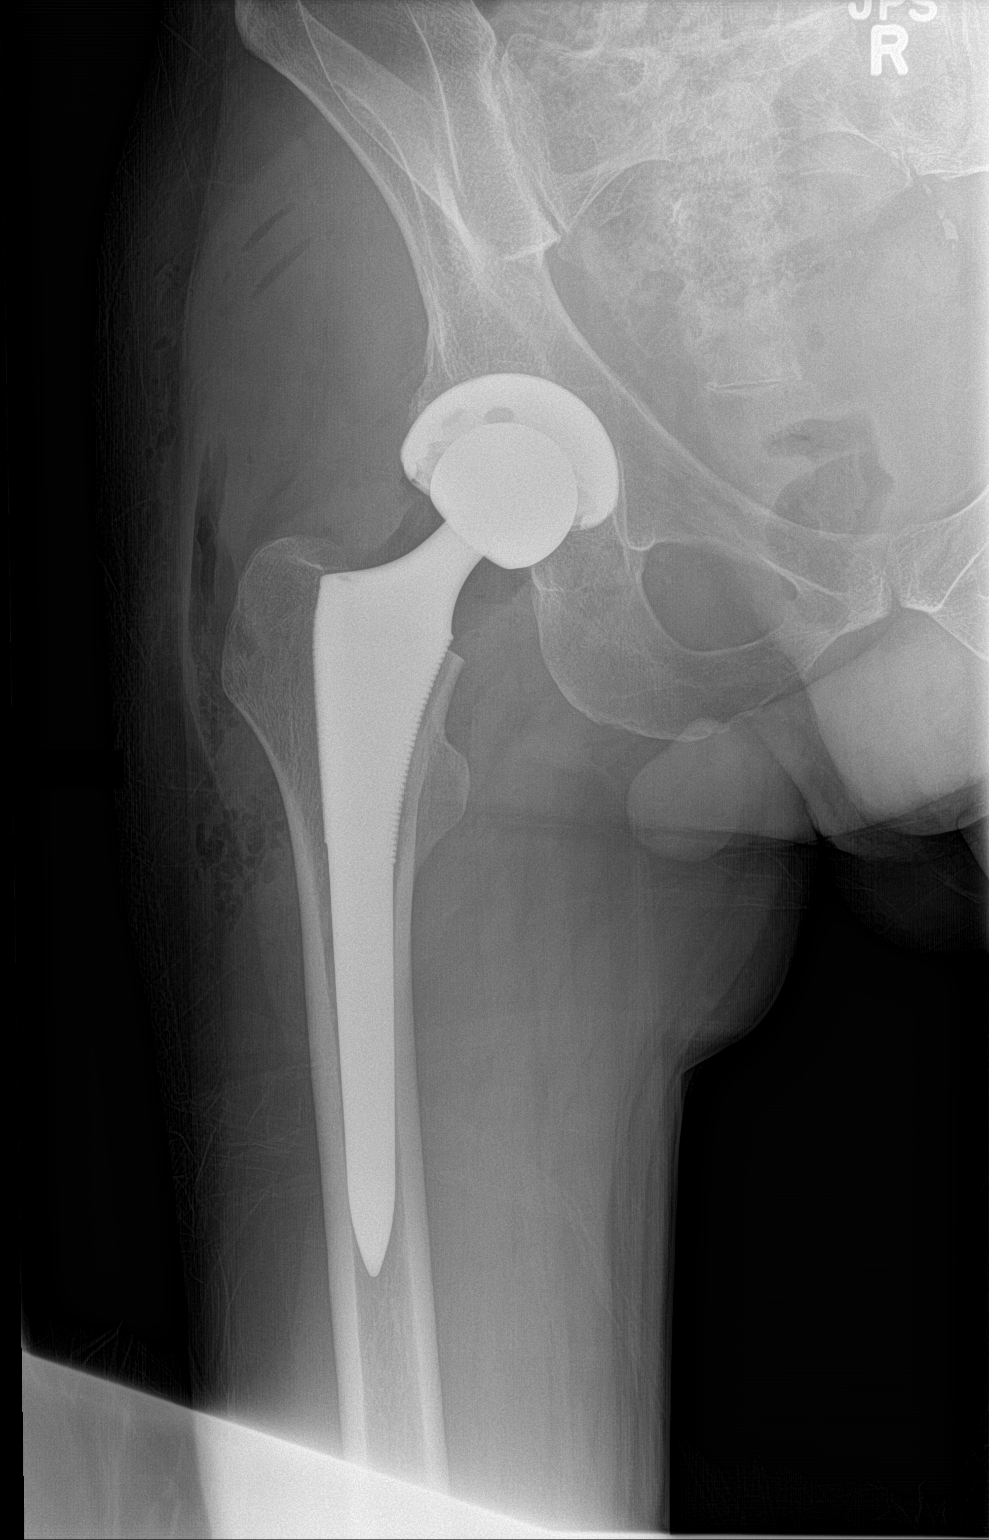

[x table lat]
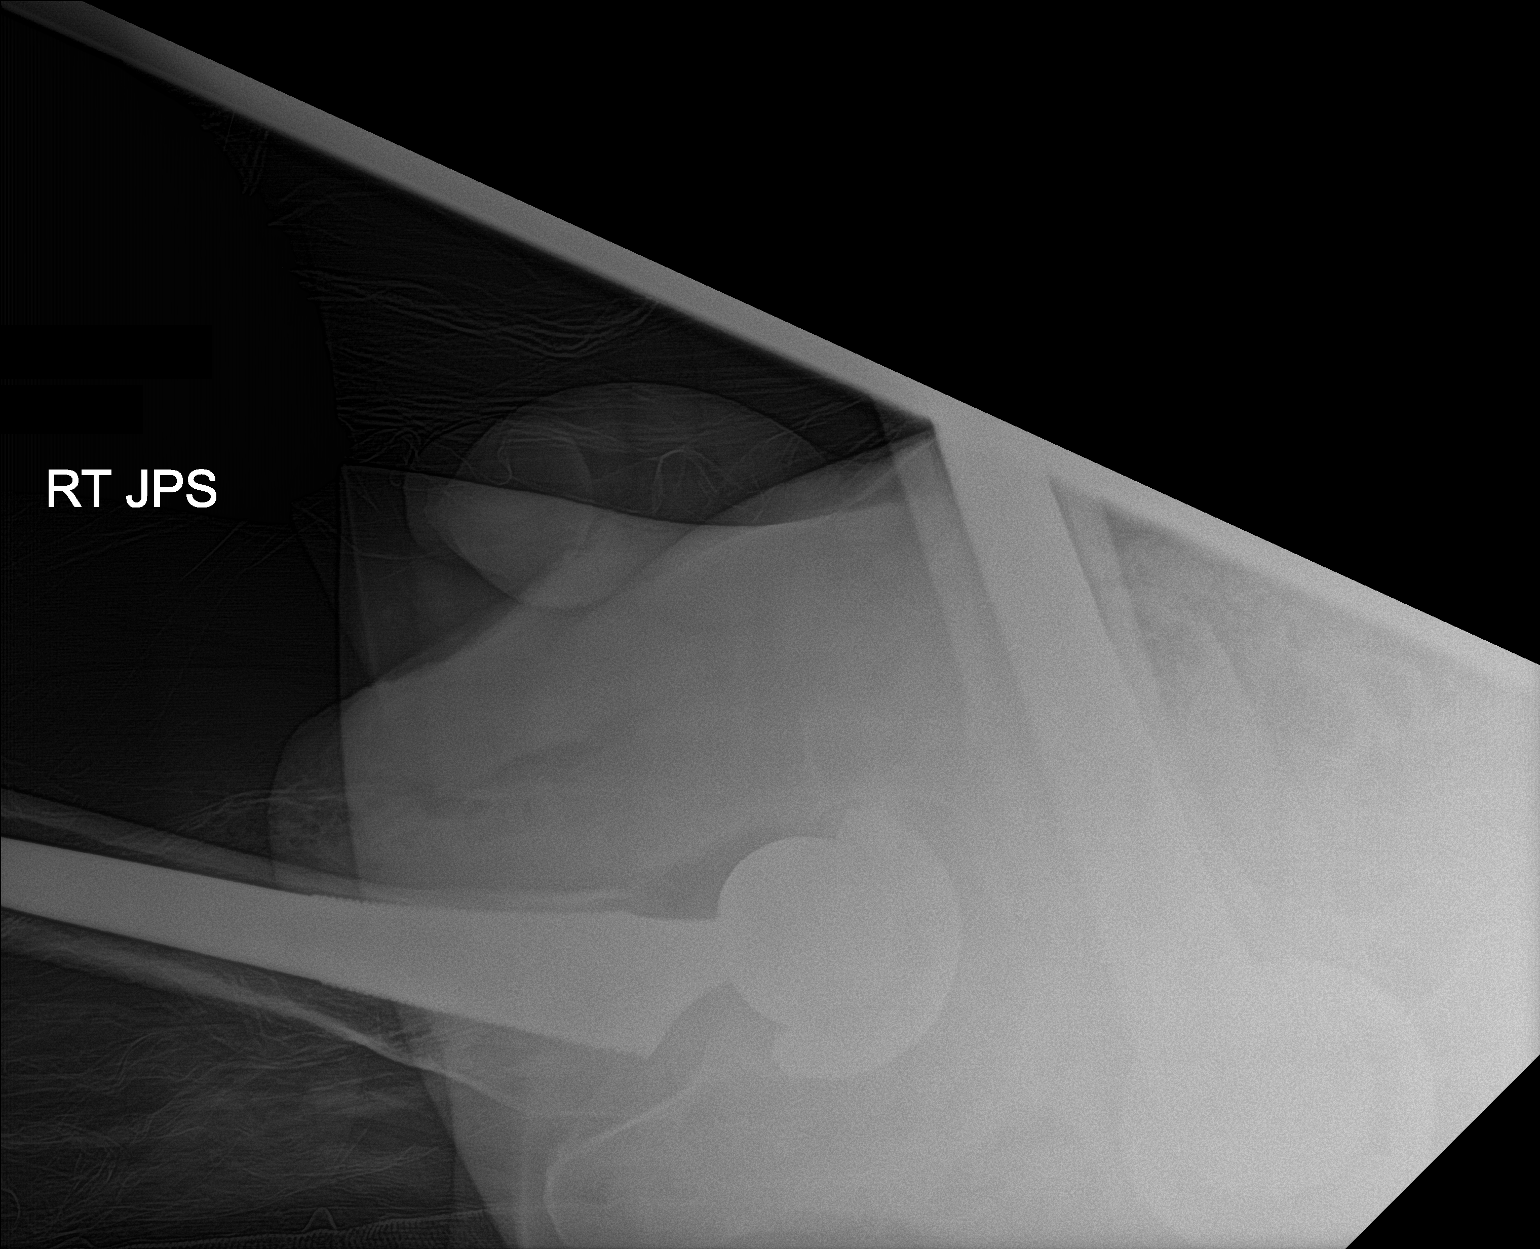

[3 of 3 positions shown; findings below may reference images not displayed]

FINDINGS: Right hip replacement with good anatomic alignment. Hardware intact.
Prior left hip replacement. Performed os calcification. Diffuse
osteopenia .
IMPRESSION: Right hip replacement with good anatomic alignment. Hardware intact.

## 2018-04-23 DIAGNOSIS — J449 Chronic obstructive pulmonary disease, unspecified: Secondary | ICD-10-CM | POA: Diagnosis not present

## 2018-04-23 DIAGNOSIS — H6191 Disorder of right external ear, unspecified: Secondary | ICD-10-CM | POA: Diagnosis not present

## 2018-04-23 DIAGNOSIS — H6123 Impacted cerumen, bilateral: Secondary | ICD-10-CM | POA: Diagnosis not present

## 2018-04-30 DIAGNOSIS — H6121 Impacted cerumen, right ear: Secondary | ICD-10-CM | POA: Diagnosis not present

## 2018-04-30 DIAGNOSIS — Z6820 Body mass index (BMI) 20.0-20.9, adult: Secondary | ICD-10-CM | POA: Diagnosis not present

## 2018-06-26 DIAGNOSIS — Z96642 Presence of left artificial hip joint: Secondary | ICD-10-CM | POA: Diagnosis not present
